# Patient Record
Sex: Female | Born: 1965
Health system: Southern US, Community
[De-identification: ages and names within clinical notes are randomized; demographics above are authoritative.]

## PROBLEM LIST (undated history)

## (undated) DIAGNOSIS — Z87442 Personal history of urinary calculi: Secondary | ICD-10-CM

## (undated) DIAGNOSIS — B009 Herpesviral infection, unspecified: Secondary | ICD-10-CM

## (undated) DIAGNOSIS — N200 Calculus of kidney: Secondary | ICD-10-CM

## (undated) DIAGNOSIS — E88819 Insulin resistance, unspecified: Secondary | ICD-10-CM

## (undated) DIAGNOSIS — N83202 Unspecified ovarian cyst, left side: Secondary | ICD-10-CM

## (undated) DIAGNOSIS — E8881 Metabolic syndrome: Secondary | ICD-10-CM

## (undated) HISTORY — DX: Herpesviral infection, unspecified: B00.9

## (undated) HISTORY — DX: Insulin resistance, unspecified: E88.819

## (undated) HISTORY — PX: NO PAST SURGERIES: SHX2092

## (undated) HISTORY — DX: Metabolic syndrome: E88.81

---
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
## 2002-04-03 DIAGNOSIS — B009 Herpesviral infection, unspecified: Secondary | ICD-10-CM

## 2002-04-03 HISTORY — DX: Herpesviral infection, unspecified: B00.9

## 2003-02-18 ENCOUNTER — Other Ambulatory Visit: Admission: RE | Admit: 2003-02-18 | Discharge: 2003-02-18 | Payer: Self-pay | Admitting: *Deleted

## 2003-02-19 ENCOUNTER — Encounter: Admission: RE | Admit: 2003-02-19 | Discharge: 2003-02-19 | Payer: Self-pay | Admitting: *Deleted

## 2004-04-25 ENCOUNTER — Other Ambulatory Visit: Admission: RE | Admit: 2004-04-25 | Discharge: 2004-04-25 | Payer: Self-pay | Admitting: *Deleted

## 2005-05-15 ENCOUNTER — Other Ambulatory Visit: Admission: RE | Admit: 2005-05-15 | Discharge: 2005-05-15 | Payer: Self-pay | Admitting: Obstetrics & Gynecology

## 2006-10-23 ENCOUNTER — Other Ambulatory Visit: Admission: RE | Admit: 2006-10-23 | Discharge: 2006-10-23 | Payer: Self-pay | Admitting: Obstetrics & Gynecology

## 2007-01-31 ENCOUNTER — Encounter: Admission: RE | Admit: 2007-01-31 | Discharge: 2007-01-31 | Payer: Self-pay | Admitting: Obstetrics & Gynecology

## 2008-01-27 ENCOUNTER — Other Ambulatory Visit: Admission: RE | Admit: 2008-01-27 | Discharge: 2008-01-27 | Payer: Self-pay | Admitting: Obstetrics & Gynecology

## 2008-02-03 ENCOUNTER — Encounter: Admission: RE | Admit: 2008-02-03 | Discharge: 2008-02-03 | Payer: Self-pay | Admitting: Obstetrics & Gynecology

## 2008-11-01 LAB — HM MAMMOGRAPHY: HM Mammogram: NORMAL

## 2010-03-03 LAB — HM PAP SMEAR: HM Pap smear: NORMAL

## 2010-10-25 ENCOUNTER — Telehealth: Payer: Self-pay | Admitting: *Deleted

## 2010-10-25 NOTE — Telephone Encounter (Signed)
Yes, work her in whenever she wants to be seen!

## 2010-10-25 NOTE — Telephone Encounter (Signed)
Patient requesting earlier new patient apt. She is scheduled for 8/22. She states she is in more pain than when she last saw Dr Yetta Barre. Ok for earlier new patient apt?

## 2010-10-26 NOTE — Telephone Encounter (Signed)
Rescheduled

## 2010-11-02 ENCOUNTER — Ambulatory Visit (INDEPENDENT_AMBULATORY_CARE_PROVIDER_SITE_OTHER)
Admission: RE | Admit: 2010-11-02 | Discharge: 2010-11-02 | Disposition: A | Discharge status: Home / Self Care | Payer: BC Managed Care – PPO | Source: Ambulatory Visit | Attending: Internal Medicine | Admitting: Internal Medicine

## 2010-11-02 ENCOUNTER — Encounter: Payer: Self-pay | Admitting: Internal Medicine

## 2010-11-02 ENCOUNTER — Ambulatory Visit (INDEPENDENT_AMBULATORY_CARE_PROVIDER_SITE_OTHER): Payer: BC Managed Care – PPO | Admitting: Internal Medicine

## 2010-11-02 VITALS — BP 102/64 | HR 58 | Temp 98.8°F | Resp 16 | Ht 61.0 in | Wt 135.0 lb

## 2010-11-02 DIAGNOSIS — M545 Low back pain, unspecified: Secondary | ICD-10-CM

## 2010-11-02 DIAGNOSIS — M79604 Pain in right leg: Secondary | ICD-10-CM

## 2010-11-02 MED ORDER — PREGABALIN 50 MG PO CAPS: 50.0000 mg | ORAL_CAPSULE | Freq: Every evening | ORAL | Status: DC | PRN

## 2010-11-02 NOTE — Patient Instructions (Signed)
Back Pain (Lumbosacral Strain) Back pain is one of the most common causes of pain. There are many causes of back pain. Most are not serious conditions.  CAUSES Your backbone (spinal column) is made up of 24 main vertebral bodies, the sacrum, and the coccyx. These are held together by muscles and tough, fibrous tissue (ligaments). Nerve roots pass through the openings between the vertebrae. A sudden move or injury to the back may cause injury to, or pressure on, these nerves. This may result in localized back pain or pain movement (radiation) into the buttocks, down the leg, and into the foot. Sharp, shooting pain from the buttock down the back of the leg (sciatica) is frequently associated with a ruptured (herniated) disc. Pain may be caused by muscle spasm alone. Your caregiver can often find the cause of your pain by the details of your symptoms and an exam. In some cases, you may need tests (such as X-rays). Your caregiver will work with you to decide if any tests are needed based on your specific exam. HOME CARE INSTRUCTIONS  Avoid an underactive lifestyle. Active exercise, as directed by your caregiver, is your greatest weapon against back pain.   Avoid hard physical activities (tennis, racquetball, water-skiing) if you are not in proper physical condition for it. This may aggravate and/or create problems.   If you have a back problem, avoid sports requiring sudden body movements. Swimming and walking are generally safer activities.   Maintain good posture.   Avoid becoming overweight (obese).   Use bed rest for only the most extreme, sudden (acute) episode. Your caregiver will help you determine how much bed rest is necessary.   For acute conditions, you may put ice on the injured area.   Put ice in a plastic bag.   Place a towel between your skin and the bag.   Leave the ice on for 20 minutes at a time, every 2 hours, or as needed.   After you are improved and more active, it may  help to apply heat for 30 minutes before activities.  See your caregiver if you are having pain that lasts longer than expected. Your caregiver can advise appropriate exercises and/or therapy if needed. With conditioning, most back problems can be avoided. SEEK IMMEDIATE MEDICAL CARE IF:  You have numbness, tingling, weakness, or problems with the use of your arms or legs.   You experience severe back pain not relieved with medicines.   There is a change in bowel or bladder control.   You have increasing pain in any area of the body, including your belly (abdomen).   You notice shortness of breath, dizziness, or feel faint.   You feel sick to your stomach (nauseous), are throwing up (vomiting), or become sweaty.   You notice discoloration of your toes or legs, or your feet get very cold.   Your back pain is getting worse.   You have an oral temperature above 100.5, not controlled by medicine.  MAKE SURE YOU:   Understand these instructions.   Will watch your condition.   Will get help right away if you are not doing well or get worse.  Document Released: 12/28/2004 Document Re-Released: 06/14/2009 Exit[employer] Patient Information 2011 [employer]. 

## 2010-11-02 NOTE — Progress Notes (Signed)
Subjective:    Patient ID: Jill Craig, female    DOB: [birth year], 45 y.o.   MRN: [id number]  Back Pain This is a new problem. The current episode started more than 1 month ago (6 months ago). The problem occurs intermittently. The problem has been gradually worsening since onset. The pain is present in the lumbar spine. The quality of the pain is described as aching, burning and shooting. The pain radiates to the left thigh and right thigh. The pain is at a severity of 5/10. The pain is moderate. The pain is worse during the day. The symptoms are aggravated by sitting and bending. Stiffness is present all day. Pertinent negatives include no abdominal pain, bladder incontinence, bowel incontinence, chest pain, dysuria, fever, headaches, leg pain, numbness, paresis, paresthesias, pelvic pain, perianal numbness, tingling, weakness or weight loss. She has tried NSAIDs for the symptoms. The treatment provided mild relief.      Review of Systems  Constitutional: Negative for fever, chills, weight loss, diaphoresis, activity change, appetite change, fatigue and unexpected weight change.  HENT: Negative.   Eyes: Negative.   Respiratory: Negative.   Cardiovascular: Negative.  Negative for chest pain.  Gastrointestinal: Negative.  Negative for abdominal pain and bowel incontinence.  Genitourinary: Negative for bladder incontinence, dysuria, urgency, frequency, hematuria, flank pain, decreased urine volume, enuresis, difficulty urinating, pelvic pain and dyspareunia.  Musculoskeletal: Positive for back pain. Negative for myalgias, joint swelling, arthralgias and gait problem.  Skin: Negative.   Neurological: Negative for dizziness, tingling, tremors, seizures, syncope, facial asymmetry, speech difficulty, weakness, light-headedness, numbness, headaches and paresthesias.  Hematological: Negative for adenopathy. Does not bruise/bleed easily.  Psychiatric/Behavioral: Negative.        Objective:   Physical Exam  Vitals reviewed. Constitutional: She is oriented to person, place, and time. She appears well-developed and well-nourished. No distress.  HENT:  Head: Normocephalic and atraumatic.  Right Ear: External ear normal.  Left Ear: External ear normal.  Nose: Nose normal.  Mouth/Throat: Oropharynx is clear and moist. No oropharyngeal exudate.  Eyes: Conjunctivae and EOM are normal. Pupils are equal, round, and reactive to light. Right eye exhibits no discharge. Left eye exhibits no discharge. No scleral icterus.  Neck: Normal range of motion. Neck supple. No JVD present. No tracheal deviation present. No thyromegaly present.  Cardiovascular: Normal rate, regular rhythm, normal heart sounds and intact distal pulses.  Exam reveals no gallop and no friction rub.   No murmur heard. Pulmonary/Chest: Effort normal and breath sounds normal. No stridor. No respiratory distress. She has no wheezes. She has no rales. She exhibits no tenderness.  Abdominal: Soft. Bowel sounds are normal. She exhibits no distension and no mass. There is no tenderness. There is no rebound and no guarding.  Musculoskeletal: Normal range of motion. She exhibits no edema and no tenderness.       Lumbar back: Normal. She exhibits normal range of motion, no tenderness, no bony tenderness, no swelling, no edema, no deformity, no laceration, no pain, no spasm and normal pulse.  Lymphadenopathy:    She has no cervical adenopathy.  Neurological: She is alert and oriented to person, place, and time. She has normal strength. She is not disoriented. She displays no atrophy, no tremor and normal reflexes. No cranial nerve deficit or sensory deficit. She exhibits normal muscle tone. She displays a negative Romberg sign. She displays no seizure activity. Coordination and gait normal.  Reflex Scores:      Tricep reflexes are 1+ on the right side  and 1+ on the left side.      Bicep reflexes are 1+ on the right side and 1+ on the  left side.      Brachioradialis reflexes are 1+ on the right side and 1+ on the left side.      Patellar reflexes are 0 on the right side and 0 on the left side.      Achilles reflexes are 0 on the right side and 0 on the left side. Skin: Skin is warm and dry. No rash noted. She is not diaphoretic. No erythema. No pallor.  Psychiatric: She has a normal mood and affect. Her behavior is normal. Judgment and thought content normal.          Assessment & Plan:

## 2010-11-03 NOTE — Assessment & Plan Note (Signed)
Plain films show minimal facet arthropathy, I think she has HNP so I will start her on Lyrica for pain and get an MRI done of her lower back to obtain more info

## 2010-11-09 ENCOUNTER — Other Ambulatory Visit: Payer: Self-pay | Admitting: Internal Medicine

## 2010-11-09 DIAGNOSIS — M545 Low back pain, unspecified: Secondary | ICD-10-CM

## 2010-11-09 DIAGNOSIS — M79604 Pain in right leg: Secondary | ICD-10-CM

## 2010-11-23 ENCOUNTER — Ambulatory Visit: Payer: Self-pay | Admitting: Internal Medicine

## 2010-11-24 ENCOUNTER — Encounter: Payer: BC Managed Care – PPO | Admitting: Neurosurgery

## 2010-11-30 ENCOUNTER — Encounter: Payer: BC Managed Care – PPO | Attending: Neurosurgery | Admitting: Neurosurgery

## 2010-11-30 DIAGNOSIS — M129 Arthropathy, unspecified: Secondary | ICD-10-CM | POA: Insufficient documentation

## 2010-11-30 DIAGNOSIS — M545 Low back pain, unspecified: Secondary | ICD-10-CM

## 2010-11-30 DIAGNOSIS — G8929 Other chronic pain: Secondary | ICD-10-CM | POA: Insufficient documentation

## 2010-11-30 NOTE — Progress Notes (Signed)
HISTORY OF PRESENT ILLNESS:  Ms. Jill Craig is a new patient referred to clinic through her primary care doctor, Dr. Sanda Linger.  The patient is seen for low back pain.  She states she has had no radiculopathy, but increasing central low back pain and over the last 6 months she noticed she is losing a lot of flexibility.  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  Her husband is also an Naval architect and does the same type of work.  She states she only takes 6-8 ibuprofen a day without any prescription medicines.  She has had no other treatment.  The patient states that she cannot exactly pinpoint what starts or triggers her pain and it does fluctuate from time to time but it is becoming more annoying for her.  She states she used to be active with tae kwon do when she was in her 61s and up until 6 months ago, could still do many of the movements she did when she practiced as well as she could bend from the waist and put her hands flat on the floor but she cannot even attempt that now.  She rates her average pain at 3-7, that is an aching type pain.  General activity level is 2-9. Pain is worse during the day and evening.  Sleep patterns are fair. Pain is worse with bending and sitting.  Medication tends to help.  She does climb steps and drive.  She walks unassisted.  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  REVIEW OF SYSTEMS:  Notable for those difficulties as well as some mild weight gain otherwise within normal limits.  Her Oswestry score is 42. The only physician involved in her care is her primary care doctor, Dr. Yetta Barre.  X-rays that he ordered were reviewed today with the patient and then only shows normal vertebral body height.  No pars defects were even identified.  No fractures.  Mild facet arthropathy at L4-L5.  PAST  MEDICAL HISTORY:  Benign.  She has had no surgeries.  No difficulties.  MEDICATIONS:  She takes no medicine.  SOCIAL HISTORY:  She is married, lives with her husband.  FAMILY HISTORY:  Not obtained.  PHYSICAL EXAMINATION:  VITAL SIGNS:  Her blood pressure is 96/56, pulse 55, respirations 18, and O2 sats 99 on room air. CONSTITUTIONAL:  She is within normal limits. NEUROLOGIC:  She is alert and oriented x3.  She is very pleasant.  She has a normal gait.  Motor strength is 5/5 in the upper and lower extremities including the deltoid, biceps, triceps, intrinsics, grip, iliopsoas, quadriceps, dorsiflexors, EHL, and plantar flexors.  Her toes are downgoing.  Her reflexes are absent in the upper and lower extremities bilaterally.  Her sensation is intact to pinprick throughout the upper and lower extremities.  She can flex about 90 degrees, extend about 10 degrees with mild pain with flexion.  She is not point tender in her lumbar spine or any fibromyalgia points.  ASSESSMENT:  Chronic low back pain, worsening over the past 6 months, unknown etiology.  PLAN: 1. We will start her on Sterapred 6-day Dosepak 10 mg.  She will take     that and see if she gets any relief that may be diagnostic for     inflammation or an inflammatory process. 2. She will complete her UDS and review that  at her next appointment.     We are going to obtain an MRI of the lumbar spine without     gadolinium and bring her back to review that with Dr. Wynn Banker,     possible injection     review dependent on what the MRI reveals.  We will see her back in     the clinic in 4-6 weeks.  Her questions were otherwise encouraged     and answered.     Costantino Kohlbeck L. Blima Dessert Electronically Signed    RLW/MedQ D:  11/30/2010 11:23:58  T:  11/30/2010 11:49:55  Job #:  [id number]  cc:   Tia Alert, MD Fax: [phone]

## 2010-12-15 ENCOUNTER — Other Ambulatory Visit: Payer: Self-pay | Admitting: Physical Medicine & Rehabilitation

## 2010-12-15 ENCOUNTER — Ambulatory Visit (HOSPITAL_COMMUNITY)
Admission: RE | Admit: 2010-12-15 | Discharge: 2010-12-15 | Disposition: A | Discharge status: Home / Self Care | Payer: BC Managed Care – PPO | Source: Ambulatory Visit | Attending: Physical Medicine & Rehabilitation | Admitting: Physical Medicine & Rehabilitation

## 2010-12-15 ENCOUNTER — Other Ambulatory Visit (HOSPITAL_COMMUNITY): Payer: BC Managed Care – PPO

## 2010-12-15 DIAGNOSIS — M51379 Other intervertebral disc degeneration, lumbosacral region without mention of lumbar back pain or lower extremity pain: Secondary | ICD-10-CM | POA: Insufficient documentation

## 2010-12-15 DIAGNOSIS — M5137 Other intervertebral disc degeneration, lumbosacral region: Secondary | ICD-10-CM | POA: Insufficient documentation

## 2010-12-15 DIAGNOSIS — R52 Pain, unspecified: Secondary | ICD-10-CM

## 2010-12-15 DIAGNOSIS — M545 Low back pain, unspecified: Secondary | ICD-10-CM | POA: Insufficient documentation

## 2010-12-20 ENCOUNTER — Encounter: Payer: BC Managed Care – PPO | Attending: Neurosurgery

## 2010-12-20 ENCOUNTER — Ambulatory Visit (HOSPITAL_BASED_OUTPATIENT_CLINIC_OR_DEPARTMENT_OTHER): Payer: BC Managed Care – PPO | Admitting: Physical Medicine & Rehabilitation

## 2010-12-20 DIAGNOSIS — G8929 Other chronic pain: Secondary | ICD-10-CM | POA: Insufficient documentation

## 2010-12-20 DIAGNOSIS — M545 Low back pain, unspecified: Secondary | ICD-10-CM | POA: Insufficient documentation

## 2010-12-20 DIAGNOSIS — M129 Arthropathy, unspecified: Secondary | ICD-10-CM | POA: Insufficient documentation

## 2010-12-20 DIAGNOSIS — M533 Sacrococcygeal disorders, not elsewhere classified: Secondary | ICD-10-CM

## 2010-12-21 NOTE — Assessment & Plan Note (Signed)
Ms. Lacasse returns today.  She saw my mid-level last month.  She has a history of lumbar pain increasing over the last 6 months.  She had original low back injury when she was about 45 years old lifting something at work.  She went through some chiropractic treatments and got all the way better.  She complains of decreased flexibility.  She also complains of decreased sitting tolerance.  She also has difficulty sleeping in a certain position.  She has a 6-7/10 pain.  She is reluctant to take too many medications.  She does get some relief with the ibuprofen.  She had a few days of complete relief after the Medrol Dosepak, but pain has returned.  She walks without assistance.  She can walk 2 hours at a time.  She climbs steps.  She drives.  She works 40 hours a week plus.  REVIEW OF SYSTEMS:  Positive for weight gain, otherwise negative.  X-rays reviewed revealing some anterolisthesis which is minimal L4 and 5 due to facet degeneration.  Of note is that the patient states that when she was extending for the x-rays, this did cause her back pain to flare up.  SOCIAL HISTORY:  Married, lives with her husband.  Denies any alcohol or illegal drug use.  PHYSICAL EXAMINATION:  VITAL SIGNS:  Blood pressure 97/58, pulse 60, respirations 14, O2 sat 100% on room air. GENERAL:  A well-developed, well-nourished female in no acute distress. Mood and affect are appropriate. MUSCULOSKELETAL:  Motor strength 5/5 bilateral upper and lower extremities including deltoid biceps, triceps, hand intrinsic grip, iliopsoas, quadriceps, dorsiflexors, EHL, plantar flexors.  Toes are downgoing.  Reflexes are difficult to elicit in all four extremities. Sensation intact in lower extremities to pinprick.  She has normal range of motion in the lumbar spine.  She has more discomfort with extension and with flexion, although it is not clear cut and not severe.  Pearlean Brownie test is negative.  Stress in the SI is  positive on the right side, but not on the left side.  IMPRESSION:  Low back pain.  This is lower lumbar, this is around or below L5 nothing above L4 certainly.  There is no radicular component. I went through differential with her using spine model.  I think most likely it is sacroiliac disorder followed by facet disorder given some of her radiologic findings.  We will do diagnosis/therapeutic SI injection, next visit.  If she does not respond well to this, we would do facet injections.  Positive response will be followed up with physical therapy.  Negative response will be followed up with MRI.  Discussed with patient, agrees with plan. She will continue on ibuprofen which she will take with food now up to 8 tablets per day.     Erick Colace, M.D. Electronically Signed    AEK/MedQ D:  12/20/2010 13:11:12  T:  12/20/2010 20:46:29  Job #:  [id number]  cc:   Sanda Linger, MD [address] [place] [address] [place] [address]

## 2011-01-05 ENCOUNTER — Ambulatory Visit: Payer: BC Managed Care – PPO | Admitting: Physical Medicine & Rehabilitation

## 2012-08-14 ENCOUNTER — Telehealth: Payer: Self-pay | Admitting: *Deleted

## 2012-08-14 MED ORDER — VALACYCLOVIR HCL 500 MG PO TABS: 500.0000 mg | ORAL_TABLET | ORAL | Status: DC | PRN

## 2012-08-14 NOTE — Telephone Encounter (Signed)
Pt requested refills on her Valtrex Rx (via fax). pts next aex is 03/14/2013. Valtrex 500mg  po qd, prn #30/6 refills sent to pts pharmacy (in [place]).

## 2013-01-29 ENCOUNTER — Ambulatory Visit (INDEPENDENT_AMBULATORY_CARE_PROVIDER_SITE_OTHER): Payer: BC Managed Care – PPO | Admitting: Gynecology

## 2013-01-29 ENCOUNTER — Encounter: Payer: Self-pay | Admitting: Gynecology

## 2013-01-29 ENCOUNTER — Telehealth: Payer: Self-pay | Admitting: Obstetrics & Gynecology

## 2013-01-29 VITALS — BP 90/62 | HR 52 | Ht 61.0 in | Wt 125.0 lb

## 2013-01-29 DIAGNOSIS — N63 Unspecified lump in unspecified breast: Secondary | ICD-10-CM

## 2013-01-29 DIAGNOSIS — N6320 Unspecified lump in the left breast, unspecified quadrant: Secondary | ICD-10-CM

## 2013-01-29 DIAGNOSIS — N644 Mastodynia: Secondary | ICD-10-CM

## 2013-01-29 NOTE — Telephone Encounter (Signed)
Patient calling re: painful left breast and also feels a bump on it. Patient wants to be seen today.

## 2013-01-29 NOTE — Telephone Encounter (Signed)
Spoke with patient. She is very anxious about a lump that she felt in her breast last night, states that it is painful to palpate and it is visible. Denies redness or warmth of skin.  She requests appointment today. Has seen Dr. Hyacinth Meeker and Dr. Farrel Gobble previously. Patient would like to be seen today. Appointment scheduled for today with Dr. Farrel Gobble. Patient agreeable.   Routing to provider for final review. Patient agreeable to disposition. Will close encounter

## 2013-01-29 NOTE — Progress Notes (Signed)
Subjective:     Patient ID: Jill Craig, female   DOB: 11-Jan-1966, 47 y.o.   MRN: [id number]  HPI Comments: Pt here reporting a lump that she noticed in left breast yesterday, reports that area was tender that prompted inspection.  Pt denies any skin changes.  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  No change in activities, no upper extremity exercises.  Pt reports doing regular breast exam-once a month but hasn't had a mammogram in years.  Pt states she is sure that this lesion was not present 1w ago.  She continues to have regular cycles.  Pt's sister has breast cancer    Review of Systems  All other systems reviewed and are negative.       Objective:   Physical Exam  Nursing note and vitals reviewed. Constitutional: She appears well-developed and well-nourished.  Pulmonary/Chest: Right breast exhibits no inverted nipple, no mass, no nipple discharge, no skin change and no tenderness. Left breast exhibits mass and tenderness (generalized, morre so at sternum). Left breast exhibits no inverted nipple, no nipple discharge and no skin change. Breasts are symmetrical.    Lymphadenopathy:    She has no axillary adenopathy.       Right axillary: No pectoral and no lateral adenopathy present.       Left axillary: No pectoral and no lateral adenopathy present.      Right: No supraclavicular adenopathy present.       Left: No supraclavicular adenopathy present.       Assessment:     Mastalgia Breast mass on left Family history breast cancer      Plan:     Bilateral mammogram ordered Questions addressed

## 2013-01-31 ENCOUNTER — Telehealth: Payer: Self-pay | Admitting: Obstetrics & Gynecology

## 2013-01-31 NOTE — Telephone Encounter (Signed)
Patient was here on Wednesday and was told to call today if she hadn't heard from the breast center about a diagnostic mammogram. She still hasnt heard anything about it.

## 2013-01-31 NOTE — Telephone Encounter (Signed)
Called [facility] imaging, earliest available 11/18. Patient does not want to wait that long. [place], they can see her on 11/3, but will need records. Spoke with patient and she will call [facility] imaging to discuss records transfer and then call [facility] to schedule.  Written order for diagnostic MMG signed by Dr. Farrel Gobble and faxed with fax confirmation received from [facility].

## 2013-02-05 ENCOUNTER — Telehealth: Payer: Self-pay | Admitting: Gynecology

## 2013-02-05 NOTE — Telephone Encounter (Signed)
Dr. Farrel Gobble have you seen these results from [facility] yet?

## 2013-02-05 NOTE — Telephone Encounter (Signed)
Pt had a mammogram done and wants to know her results

## 2013-02-05 NOTE — Telephone Encounter (Signed)
Dr. Farrel Gobble, I called [facility] and asked them to fax results. They stated that the Doctor spoke with her at her appointment. I called and spoke with patient and she states that she was told that she has two cysts, one simple and one complex. Patient states that she would like to have both cysts removed and requests referral to General Surgery to do so.  I advised that I would send request to Dr. Farrel Gobble.

## 2013-02-06 NOTE — Telephone Encounter (Signed)
When it comes in, i will look at the report and then can make referral if indicated

## 2013-02-07 NOTE — Telephone Encounter (Signed)
Dr. Hyacinth Meeker has reviewed results. Suggest Calling [facility] to schedule U/S guided Cyst drainage. Message left to return call to Pike at [phone].

## 2013-02-07 NOTE — Telephone Encounter (Signed)
Patient returning Tracy's call. °

## 2013-02-07 NOTE — Telephone Encounter (Signed)
Order signed and faxed to [facility]. Patient will call for appointment.

## 2013-03-14 ENCOUNTER — Other Ambulatory Visit: Payer: Self-pay | Admitting: Gynecology

## 2013-03-14 ENCOUNTER — Encounter: Payer: Self-pay | Admitting: Gynecology

## 2013-03-14 ENCOUNTER — Ambulatory Visit (INDEPENDENT_AMBULATORY_CARE_PROVIDER_SITE_OTHER): Payer: BC Managed Care – PPO | Admitting: Gynecology

## 2013-03-14 VITALS — BP 112/80 | HR 60 | Resp 12 | Ht 63.5 in | Wt 131.0 lb

## 2013-03-14 DIAGNOSIS — Z01419 Encounter for gynecological examination (general) (routine) without abnormal findings: Secondary | ICD-10-CM

## 2013-03-14 DIAGNOSIS — N6009 Solitary cyst of unspecified breast: Secondary | ICD-10-CM

## 2013-03-14 DIAGNOSIS — B009 Herpesviral infection, unspecified: Secondary | ICD-10-CM

## 2013-03-14 DIAGNOSIS — N6002 Solitary cyst of left breast: Secondary | ICD-10-CM

## 2013-03-14 DIAGNOSIS — Z0000 Encounter for general adult medical examination without abnormal findings: Secondary | ICD-10-CM

## 2013-03-14 LAB — CHOLESTEROL, TOTAL: Cholesterol: 188 mg/dL (ref 0–200)

## 2013-03-14 LAB — HEMOGLOBIN, FINGERSTICK: Hemoglobin, fingerstick: 11.3 g/dL — ABNORMAL LOW (ref 12.0–16.0)

## 2013-03-14 NOTE — Patient Instructions (Signed)

## 2013-03-14 NOTE — Progress Notes (Addendum)
47 y.o. Married Hispanic female   [id number] here for annual exam. Pt is currently sexually active.  Pt is scheduled to have breast biopsy next week, 2 to be removed on left.  Pt had enough valtrex, no contraception.  Has enough valtrex for now, using rarely.  [name] last menstrual period was 03/09/2013.          Sexually active: yes  The current method of family planning is none.    Exercising: yes  walk 2-3x/wk 5 miles Last pap: 02/28/11  Alcohol: 3-5 drinks/wk Tobacco: no BSE: yes MMG: 02/03/13 Incomplete, 02/03/13 Breast ultrasound f/u in 6 months has cyst aspiration scheduled.  Hgb: 11.3 ; Urine: Unable to void    Health Maintenance  Topic Date Due  . Influenza Vaccine  11/01/2012  . Pap Smear  02/27/2014  . Tetanus/tdap  04/03/2016    Family History  Problem Relation Age of Onset  . Prostate cancer Father   . Breast cancer Sister 88    Genetics negative    Patient Active Problem List   Diagnosis Date Noted  . Low back pain radiating to both legs 11/02/2010    Past Medical History  Diagnosis Date  . HSV infection 2004    R Buttock    No past surgical history on file.  Allergies: Review of patient's allergies indicates no known allergies.  Current Outpatient Prescriptions  Medication Sig Dispense Refill  . valACYclovir (VALTREX) 500 MG tablet Take 1 tablet (500 mg total) by mouth as needed.  30 tablet  6   No current facility-administered medications for this visit.    ROS: Pertinent items are noted in HPI.  Exam:    BP 112/80  Pulse 60  Resp 12  Ht 5' 3.5" (1.613 m)  Wt 131 lb (59.421 kg)  BMI 22.84 kg/m2  LMP 03/09/2013 Weight change: @WEIGHTCHANGE @ Last 3 height recordings:  Ht Readings from Last 3 Encounters:  03/14/13 5' 3.5" (1.613 m)  01/29/13 5\' 1"  (1.549 m)  11/02/10 5\' 1"  (1.549 m)   General appearance: alert, cooperative and appears stated age Head: Normocephalic, without obvious abnormality, atraumatic Neck: no adenopathy, no  carotid bruit, no JVD, supple, symmetrical, trachea midline and thyroid not enlarged, symmetric, no tenderness/mass/nodules Lungs: clear to auscultation bilaterally Breasts: normal appearance, no masses or tenderness Heart: regular rate and rhythm, S1, S2 normal, no murmur, click, rub or gallop Abdomen: soft, non-tender; bowel sounds normal; no masses,  no organomegaly Extremities: extremities normal, atraumatic, no cyanosis or edema Skin: Skin color, texture, turgor normal. No rashes or lesions Lymph nodes: Cervical, supraclavicular, and axillary nodes normal. no inguinal nodes palpated Neurologic: Grossly normal   Pelvic: External genitalia:  no lesions              Urethra: normal appearing urethra with no masses, tenderness or lesions              Bartholins and Skenes: normal                 Vagina: normal appearing vagina with normal color and discharge, no lesions              Cervix: normal appearance              Pap taken: no        Bimanual Exam:  Uterus:  uterus is normal size, shape, consistency and nontender  Adnexa:    normal adnexa in size, nontender and no masses                                      Rectovaginal: Confirms                                      Anus:  normal sphincter tone, no lesions  A: well woman Ok with conception Breast cyst for removal HSV infection    P: mammogram as scheduled pap smear not done counseled on breast self exam, mammography screening, adequate intake of calcium and vitamin D, diet and exercise return annually or prn   An After Visit Summary was printed and given to the patient.

## 2013-03-14 NOTE — Addendum Note (Signed)
Addended by: Lorraine Lax on: 03/14/2013 04:00 PM   Modules accepted: Orders

## 2013-03-15 LAB — TSH: TSH: 1.343 u[IU]/mL (ref 0.350–4.500)

## 2013-03-17 LAB — HDL CHOLESTEROL: HDL: 70 mg/dL (ref >39)

## 2013-03-17 LAB — TRIGLYCERIDES: Triglycerides: 79 mg/dL (ref <150)

## 2013-03-18 ENCOUNTER — Telehealth: Payer: Self-pay | Admitting: *Deleted

## 2013-03-18 NOTE — Telephone Encounter (Signed)
Patient notified see labs 

## 2013-03-18 NOTE — Telephone Encounter (Signed)
Left Message To Call Back  

## 2013-03-18 NOTE — Telephone Encounter (Signed)
Message copied by Lorraine Lax on Tue Mar 18, 2013  8:45 AM ------      Message from: Douglass Rivers      Created: Mon Mar 17, 2013  8:07 PM       Inform cholesterol excellent ------

## 2013-03-24 ENCOUNTER — Telehealth: Payer: Self-pay | Admitting: Obstetrics & Gynecology

## 2013-03-24 NOTE — Telephone Encounter (Signed)
Patient is asking to talk with Dr.Miller's nurse. (no details given)

## 2013-03-24 NOTE — Telephone Encounter (Signed)
Spoke with patient. She states she has been having uti symptoms since Thursday, having dysuria and urinary frequency. No fevers or flank pain. Using otc AZO with relief, but then symptoms immediately return.  She is currently out of town in [place]. Advised patient that she will need to be evaluated by provider while out of town, suggested urgent care and seen for UA and urine culture to r/o any other infection. Patient states that is very inconvenient and I apologized for the inconvenience but it is our policy that patient be seen for evaluation.   Routing to provider for final review. Patient agreeable to disposition. Will close encounter

## 2013-03-25 ENCOUNTER — Telehealth: Payer: Self-pay | Admitting: Obstetrics & Gynecology

## 2013-03-25 MED ORDER — CIPROFLOXACIN HCL 500 MG PO TABS: 500.0000 mg | ORAL_TABLET | Freq: Two times a day (BID) | ORAL | Status: DC

## 2013-03-25 NOTE — Telephone Encounter (Signed)
Pt says she is returning a call to [place] with information regarding pharmacy for Dr Hyacinth Meeker. [facility] in [place], [place] [address] to call in medication.

## 2013-03-25 NOTE — Telephone Encounter (Signed)
Please let her know RX for cipro 500mg  bid x 5 days sent to pharmacy.  If symptoms worsen, go be seen locally.  Thanks.

## 2013-03-25 NOTE — Telephone Encounter (Signed)
Routing to Dr. Hyacinth Meeker for medication order. Pharmacy placed in order entry area.

## 2013-03-25 NOTE — Telephone Encounter (Signed)
Message left to return call to Jill Craig at [phone].    

## 2013-05-02 ENCOUNTER — Encounter: Payer: Self-pay | Admitting: Obstetrics & Gynecology

## 2013-06-05 ENCOUNTER — Telehealth: Payer: Self-pay | Admitting: Obstetrics & Gynecology

## 2013-06-05 NOTE — Telephone Encounter (Signed)
Spoke with patient. She has noticed again, mass in L Breast.   Dr. Sabra Heck not in office. Patient has seen Dr. Charlies Constable prior. Agreeable to appointment with Dr. Charlies Constable.  Scheduled for 3/6 at 1200.   Routing to provider for final review. Patient agreeable to disposition. Will close encounter

## 2013-06-05 NOTE — Telephone Encounter (Signed)
Patient is calling patient said she has some lumps on her left breast. Said she had some previously and they were taken out 5 or 6 months ago. Said they are bigger than those were.

## 2013-06-06 ENCOUNTER — Ambulatory Visit (INDEPENDENT_AMBULATORY_CARE_PROVIDER_SITE_OTHER): Payer: BC Managed Care – PPO | Admitting: Gynecology

## 2013-06-06 ENCOUNTER — Encounter: Payer: Self-pay | Admitting: Gynecology

## 2013-06-06 VITALS — BP 92/60 | HR 68 | Resp 14 | Ht 63.5 in | Wt 128.0 lb

## 2013-06-06 DIAGNOSIS — N644 Mastodynia: Secondary | ICD-10-CM

## 2013-06-06 DIAGNOSIS — N6019 Diffuse cystic mastopathy of unspecified breast: Secondary | ICD-10-CM | POA: Insufficient documentation

## 2013-06-06 DIAGNOSIS — N63 Unspecified lump in unspecified breast: Secondary | ICD-10-CM

## 2013-06-06 DIAGNOSIS — N6320 Unspecified lump in the left breast, unspecified quadrant: Secondary | ICD-10-CM

## 2013-06-06 NOTE — Progress Notes (Signed)
Subjective:     Patient ID: Jill Craig, female   DOB: 01-13-1966, 48 y.o.   MRN: [id number]  HPI Comments: Pt here reporting a mass in her left breast that she noticed a few days ago, found on routine breast exam.  Pt recently had 2 cysts drained in 12/14 and post procedure showed clearance. Pt had breast cancer at 48yo, genetics negative.   Pt reports that her cousin was diagnosed with brain mass, primary vs metastatic?  Pt is overly concerned about her own health.      Review of Systems Per HPI    Objective:   Physical Exam  Nursing note and vitals reviewed. Constitutional: She is oriented to person, place, and time. She appears well-developed and well-nourished.  Pulmonary/Chest: Right breast exhibits no inverted nipple, no nipple discharge, no skin change and no tenderness. Left breast exhibits mass and tenderness. Left breast exhibits no inverted nipple, no nipple discharge and no skin change.    Neurological: She is alert and oriented to person, place, and time.       Assessment:     Recent breast cyst aspiration with recurrence of tenderness and mass     Plan:     Pt was due for repeat imaging in 5/15, will get now and triage accordingly Sympathy re cousin given

## 2014-05-13 ENCOUNTER — Telehealth: Payer: Self-pay | Admitting: Obstetrics & Gynecology

## 2014-05-13 NOTE — Telephone Encounter (Signed)
LMTCB about cx appointment with Dr. Sabra Heck

## 2014-05-14 ENCOUNTER — Ambulatory Visit: Payer: BC Managed Care – PPO | Admitting: Obstetrics & Gynecology

## 2014-05-18 ENCOUNTER — Ambulatory Visit: Payer: Self-pay | Admitting: Nurse Practitioner

## 2015-06-12 ENCOUNTER — Encounter (HOSPITAL_COMMUNITY): Payer: Self-pay | Admitting: *Deleted

## 2015-06-12 ENCOUNTER — Emergency Department (HOSPITAL_COMMUNITY): Payer: BLUE CROSS/BLUE SHIELD

## 2015-06-12 ENCOUNTER — Emergency Department (HOSPITAL_COMMUNITY)
Admission: EM | Admit: 2015-06-12 | Discharge: 2015-06-12 | Disposition: A | Discharge status: Home / Self Care | Payer: BLUE CROSS/BLUE SHIELD | Attending: Emergency Medicine | Admitting: Emergency Medicine

## 2015-06-12 DIAGNOSIS — Z87891 Personal history of nicotine dependence: Secondary | ICD-10-CM | POA: Diagnosis not present

## 2015-06-12 DIAGNOSIS — H9209 Otalgia, unspecified ear: Secondary | ICD-10-CM | POA: Insufficient documentation

## 2015-06-12 DIAGNOSIS — Z8619 Personal history of other infectious and parasitic diseases: Secondary | ICD-10-CM | POA: Diagnosis not present

## 2015-06-12 DIAGNOSIS — J189 Pneumonia, unspecified organism: Secondary | ICD-10-CM

## 2015-06-12 DIAGNOSIS — R05 Cough: Secondary | ICD-10-CM | POA: Diagnosis present

## 2015-06-12 DIAGNOSIS — J159 Unspecified bacterial pneumonia: Secondary | ICD-10-CM | POA: Diagnosis not present

## 2015-06-12 LAB — RAPID STREP SCREEN (MED CTR MEBANE ONLY): Streptococcus, Group A Screen (Direct): NEGATIVE

## 2015-06-12 MED ORDER — BENZONATATE 100 MG PO CAPS
100.0000 mg | ORAL_CAPSULE | Freq: Once | ORAL | Status: AC
  Administered 2015-06-12: 100 mg via ORAL
  Filled 2015-06-12: qty 1

## 2015-06-12 MED ORDER — AZITHROMYCIN 250 MG PO TABS: 250.0000 mg | ORAL_TABLET | Freq: Every day | ORAL | Status: DC

## 2015-06-12 MED ORDER — IBUPROFEN 800 MG PO TABS
800.0000 mg | ORAL_TABLET | Freq: Once | ORAL | Status: AC
  Administered 2015-06-12: 800 mg via ORAL
  Filled 2015-06-12: qty 1

## 2015-06-12 NOTE — ED Provider Notes (Signed)
CSN: [id number]     Arrival date & time 06/12/15  1239 History  By signing my name below, I, Jill Craig, attest that this documentation has been prepared under the direction and in the presence of [name], [place]. Electronically Signed: Jolayne Craig, Scribe. 06/12/2015. 1:49 PM.   Chief Complaint  Patient presents with  . Sore Throat  . Cough    The history is provided by the patient. No language interpreter was used.    HPI Comments: Jill Craig is a 50 y.o. female who presents to the Emergency Department complaining of sudden onset, constant, mild sore throat and dry cough which began five days ago. She also notes an associated, subjective low grade fever three days ago as well as ear pain and nasal congestion. She reports headache and abdominal pain while coughing. She was seen at [facility] three days ago, where she received a flu test and a strep test, both of which were negative, and was prescribed tessalon and naproxen, which have provided her with no relief.   Past Medical History  Diagnosis Date  . HSV infection 2004    R Buttock   History reviewed. No pertinent past surgical history. Family History  Problem Relation Age of Onset  . Prostate cancer Father   . Breast cancer Sister 54    Genetics negative   Social History  Substance Use Topics  . Smoking status: Former Smoker    Quit date: 11/02/1990  . Smokeless tobacco: None  . Alcohol Use: 8.4 oz/week    14 Glasses of wine per week   OB History    Gravida Para Term Preterm AB TAB SAB Ectopic Multiple Living   0 0 0 0 0 0 0 0 0 0       Review of Systems  Constitutional: Positive for fever and chills.  HENT: Positive for congestion, ear pain and sore throat.   Respiratory: Positive for cough.   Gastrointestinal: Positive for abdominal pain.  Neurological: Positive for headaches.    Allergies  Review of patient's allergies indicates no known allergies.  Home Medications   Prior to  Admission medications   Medication Sig Start Date End Date Taking? Authorizing Provider  azithromycin (ZITHROMAX) 250 MG tablet Take 1 tablet (250 mg total) by mouth daily. Take first 2 tablets together, then 1 every day until finished. 06/12/15   [name], PA-C  valACYclovir (VALTREX) 500 MG tablet Take 1 tablet (500 mg total) by mouth as needed. 08/14/12   [name], MD    BP 94/61 mmHg  Pulse 68  Temp(Src) 98.7 F (37.1 C) (Oral)  Resp 18  SpO2 96%  LMP 05/18/2015 (Exact Date) Physical Exam  Constitutional: She is oriented to person, place, and time. She appears well-developed and well-nourished. No distress.  HENT:  Head: Normocephalic and atraumatic.  Right Ear: Hearing, tympanic membrane, external ear and ear canal normal.  Left Ear: Hearing, tympanic membrane, external ear and ear canal normal.  Nose: Nose normal.  Mouth/Throat: Uvula is midline and mucous membranes are normal. Posterior oropharyngeal erythema present. No oropharyngeal exudate, posterior oropharyngeal edema or tonsillar abscesses.  Mild erythema to posterior oropharynx. Patient handling secretions without difficulty.  Eyes: Conjunctivae, EOM and lids are normal. Pupils are equal, round, and reactive to light. Right eye exhibits no discharge. Left eye exhibits no discharge. No scleral icterus.  Neck: Normal range of motion. Neck supple.  Cardiovascular: Normal rate, regular rhythm, normal heart sounds, intact distal pulses and normal pulses.  Pulmonary/Chest: Effort normal and breath sounds normal. No respiratory distress. She has no wheezes. She has no rales.  Abdominal: Soft. Normal appearance and bowel sounds are normal. She exhibits no distension and no mass. There is no tenderness. There is no rigidity, no rebound and no guarding.  Musculoskeletal: Normal range of motion. She exhibits no edema or tenderness.  Neurological: She is alert and oriented to person, place, and time.  Skin: Skin is  warm, dry and intact. No rash noted. She is not diaphoretic. No erythema. No pallor.  Psychiatric: She has a normal mood and affect. Her speech is normal and behavior is normal.  Nursing note and vitals reviewed.   ED Course  Procedures   DIAGNOSTIC STUDIES: Oxygen Saturation is 96% on RA, adequate by my interpretation.   COORDINATION OF CARE: 1:49 PM Will order chest x-ray and strep test. Will administer pt cough medication in the ED. Discussed treatment plan with pt at bedside and pt agreed to plan.   Labs Review Labs Reviewed  RAPID STREP SCREEN (NOT AT [facility])  CULTURE, GROUP A STREP [facility])    Imaging Review Dg Chest 2 View  06/12/2015  CLINICAL DATA:  Cough and fever EXAM: CHEST  2 VIEW COMPARISON:  None. FINDINGS: Question patchy right parahilar airspace disease. No edema or pleural effusion. The cardiopericardial silhouette is within normal limits for size. The visualized bony structures of the thorax are intact. IMPRESSION: Question right parahilar airspace disease. Pneumonia could have this appearance. Follow-up chest x-ray in 4-6 weeks recommended to ensure resolution as neoplasm can present with similar imaging features. Electronically Signed   By: Misty Stanley M.D.   On: 06/12/2015 14:16   I have personally reviewed and evaluated these images and lab results as part of my medical decision-making. MDM   Final diagnoses:  CAP (community acquired pneumonia)    50 year old female presents with sore throat and nonproductive cough since Monday. Also reports subjective fever and bilateral ear pain. She notes coughing causes her head and abdomen to hurt. Patient is afebrile. TMs clear bilaterally. Posterior oropharynx with mild erythema. Patient handling her secretions without difficulty. Patient coughing on exam. Lungs clear to auscultation bilaterally. Abdomen soft, non-tender, non-distended. Patient given tessalon and ibuprofen in the ED. Will obtain rapid strep and CXR.    Rapid strep negative. Chest x-ray remarkable for right perihilar airspace disease, which could be consistent with pneumonia, follow-up chest radiograph 4-6 weeks recommended. Discussed findings with patient. Patient is non-toxic and well-appearing, no tachypnea or hypoxia. Will treat with azithromycin. Patient to follow up with PCP this week and in 4-6 weeks for repeat chest x-ray. Return precautions discussed. Patient verbalizes her understanding and is in agreement with plan.  BP 94/61 mmHg  Pulse 68  Temp(Src) 98.7 F (37.1 C) (Oral)  Resp 18  SpO2 96%  LMP 05/18/2015 (Exact Date)  I personally performed the services described in this documentation, which was scribed in my presence. The recorded information has been reviewed and is accurate.     [name], PA-C 06/12/15 [place], MD 06/12/15 2054

## 2015-06-12 NOTE — Discharge Instructions (Signed)
1. Medications: azithromycin, usual home medications 2. Treatment: rest, drink plenty of fluids; try warm honey, tea, throat lozenges for additional symptom relief 3. Follow Up: please followup with your primary doctor for discussion of your diagnoses and further evaluation after today's visit and for repeat chest x-ray in 4-6 weeks; if you do not have a primary care doctor use the phone number listed in your discharge paperwork to find one; please return to the ER for high fever, shortness of breath, new or worsening symptoms   Community-Acquired Pneumonia, Adult Pneumonia is an infection of the lungs. One type of pneumonia can happen while a person is in a hospital. A different type can happen when a person is not in a hospital (community-acquired pneumonia). It is easy for this kind to spread from person to person. It can spread to you if you breathe near an infected person who coughs or sneezes. Some symptoms include:  A dry cough.  A wet (productive) cough.  Fever.  Sweating.  Chest pain. HOME CARE  Take over-the-counter and prescription medicines only as told by your doctor.  Only take cough medicine if you are losing sleep.  If you were prescribed an antibiotic medicine, take it as told by your doctor. Do not stop taking the antibiotic even if you start to feel better.  Sleep with your head and neck raised (elevated). You can do this by putting a few pillows under your head, or you can sleep in a recliner.  Do not use tobacco products. These include cigarettes, chewing tobacco, and e-cigarettes. If you need help quitting, ask your doctor.  Drink enough water to keep your pee (urine) clear or pale yellow. A shot (vaccine) can help prevent pneumonia. Shots are often suggested for:  People older than 51 years of age.  People older than 51 years of age:  Who are having cancer treatment.  Who have long-term (chronic) lung disease.  Who have problems with their body's defense  system (immune system). You may also prevent pneumonia if you take these actions:  Get the flu (influenza) shot every year.  Go to the dentist as often as told.  Wash your hands often. If soap and water are not available, use hand sanitizer. GET HELP IF:  You have a fever.  You lose sleep because your cough medicine does not help. GET HELP RIGHT AWAY IF:  You are short of breath and it gets worse.  You have more chest pain.  Your sickness gets worse. This is very serious if:  You are an older adult.  Your body's defense system is weak.  You cough up blood.   This information is not intended to replace advice given to you by your health care provider. Make sure you discuss any questions you have with your health care provider.   Document Released: 09/06/2007 Document Revised: 12/09/2014 Document Reviewed: 07/15/2014 Elsevier Interactive Patient Education [employer].

## 2015-06-12 NOTE — ED Notes (Signed)
Pt reports sore that and non-productive cough that started on Monday, low grade fever on Wednesday - pt was seen at [facility] and had a negative strep and flu test at that time. Pt given rx for tessalon and naproxen w/o relief.

## 2015-06-13 ENCOUNTER — Encounter (HOSPITAL_COMMUNITY): Payer: Self-pay | Admitting: Emergency Medicine

## 2015-06-13 ENCOUNTER — Emergency Department (HOSPITAL_COMMUNITY)
Admission: EM | Admit: 2015-06-13 | Discharge: 2015-06-13 | Disposition: A | Discharge status: Home / Self Care | Payer: BLUE CROSS/BLUE SHIELD | Attending: Emergency Medicine | Admitting: Emergency Medicine

## 2015-06-13 DIAGNOSIS — R05 Cough: Secondary | ICD-10-CM | POA: Diagnosis not present

## 2015-06-13 DIAGNOSIS — R059 Cough, unspecified: Secondary | ICD-10-CM

## 2015-06-13 DIAGNOSIS — J029 Acute pharyngitis, unspecified: Secondary | ICD-10-CM | POA: Insufficient documentation

## 2015-06-13 DIAGNOSIS — Z87891 Personal history of nicotine dependence: Secondary | ICD-10-CM | POA: Diagnosis not present

## 2015-06-13 DIAGNOSIS — Z8619 Personal history of other infectious and parasitic diseases: Secondary | ICD-10-CM | POA: Diagnosis not present

## 2015-06-13 MED ORDER — HYDROCOD POLST-CPM POLST ER 10-8 MG/5ML PO SUER: 5.0000 mL | Freq: Every evening | ORAL | Status: DC | PRN

## 2015-06-13 MED ORDER — MAGIC MOUTHWASH
5.0000 mL | Freq: Once | ORAL | Status: AC
  Administered 2015-06-13: 5 mL via ORAL
  Filled 2015-06-13 (×2): qty 5

## 2015-06-13 MED ORDER — PREDNISONE 20 MG PO TABS: 40.0000 mg | ORAL_TABLET | Freq: Every day | ORAL | Status: DC

## 2015-06-13 MED ORDER — PREDNISONE 20 MG PO TABS
60.0000 mg | ORAL_TABLET | Freq: Once | ORAL | Status: AC
  Administered 2015-06-13: 60 mg via ORAL
  Filled 2015-06-13: qty 3

## 2015-06-13 MED ORDER — BENZONATATE 100 MG PO CAPS: 100.0000 mg | ORAL_CAPSULE | Freq: Once | ORAL | Status: DC

## 2015-06-13 MED ORDER — MAGIC MOUTHWASH W/LIDOCAINE: 5.0000 mL | Freq: Three times a day (TID) | ORAL | Status: DC | PRN

## 2015-06-13 MED ORDER — HYDROCOD POLST-CPM POLST ER 10-8 MG/5ML PO SUER
5.0000 mL | Freq: Once | ORAL | Status: AC
  Administered 2015-06-13: 5 mL via ORAL
  Filled 2015-06-13: qty 5

## 2015-06-13 NOTE — ED Notes (Signed)
Per patient, states was here for the same symptoms yesterday-states cough is worse

## 2015-06-13 NOTE — Discharge Instructions (Signed)
1. Medications: tussionex at nighttime for cough, prednisone, magic mouthwash for sore throat, usual home medications 2. Treatment: rest, drink plenty of fluids; continue to use warm honey, tea, throat lozenges for additional symptom relief 3. Follow Up: please followup with your primary doctor this week for discussion of your diagnoses and further evaluation after today's visit; please return to the ER for high fever, shortness of breath, new or worsening symptoms   Cough, Adult A cough helps to clear your throat and lungs. A cough may last only 2-3 weeks (acute), or it may last longer than 8 weeks (chronic). Many different things can cause a cough. A cough may be a sign of an illness or another medical condition. HOME CARE  Pay attention to any changes in your cough.  Take medicines only as told by your doctor.  If you were prescribed an antibiotic medicine, take it as told by your doctor. Do not stop taking it even if you start to feel better.  Talk with your doctor before you try using a cough medicine.  Drink enough fluid to keep your pee (urine) clear or pale yellow.  If the air is dry, use a cold steam vaporizer or humidifier in your home.  Stay away from things that make you cough at work or at home.  If your cough is worse at night, try using extra pillows to raise your head up higher while you sleep.  Do not smoke, and try not to be around smoke. If you need help quitting, ask your doctor.  Do not have caffeine.  Do not drink alcohol.  Rest as needed. GET HELP IF:  You have new problems (symptoms).  You cough up yellow fluid (pus).  Your cough does not get better after 2-3 weeks, or your cough gets worse.  Medicine does not help your cough and you are not sleeping well.  You have pain that gets worse or pain that is not helped with medicine.  You have a fever.  You are losing weight and you do not know why.  You have night sweats. GET HELP RIGHT AWAY  IF:  You cough up blood.  You have trouble breathing.  Your heartbeat is very fast.   This information is not intended to replace advice given to you by your health care provider. Make sure you discuss any questions you have with your health care provider.   Document Released: 12/01/2010 Document Revised: 12/09/2014 Document Reviewed: 05/27/2014 Elsevier Interactive Patient Education [employer].

## 2015-06-13 NOTE — ED Provider Notes (Signed)
CSN: [id number]     Arrival date & time 06/13/15  1549 History  By signing my name below, I, Jill Craig, attest that this documentation has been prepared under the direction and in the presence of Jill Chimes, PA-C. Electronically Signed: Hansel Craig, ED Scribe. 06/13/2015. 4:21 PM.    Chief Complaint  Patient presents with  . Cough    The history is provided by the patient. No language interpreter was used.     HPI Comments: Jill Craig is a 50 y.o. female who presents to the Emergency Department complaining of moderate, persistent cough initially onset 6 days ago and worsened today. Pt reports associated sore throat. She states she has been trying mucinex and tea with honey with no relief of symptoms. She reports that her sore throat is exacerbated by coughing. Pt was seen yesterday for the same symptoms. Her CXR findings were consistent with pneumonia and she was discharged with rx for azithromycin and tessalon. Rapid strep was negative. She reports that she has been taking her azithromycin and tessalon as prescribed with no relief of symptoms. She denies wheezing, SOB, emesis, nausea, abdominal pain, fever, chills.    Past Medical History  Diagnosis Date  . HSV infection 2004    R Buttock   History reviewed. No pertinent past surgical history. Family History  Problem Relation Age of Onset  . Prostate cancer Father   . Breast cancer Sister 82    Genetics negative   Social History  Substance Use Topics  . Smoking status: Former Smoker    Quit date: 11/02/1990  . Smokeless tobacco: None  . Alcohol Use: 8.4 oz/week    14 Glasses of wine per week   OB History    Gravida Para Term Preterm AB TAB SAB Ectopic Multiple Living   0 0 0 0 0 0 0 0 0 0       Review of Systems  Constitutional: Negative for fever and chills.  HENT: Positive for sore throat.   Respiratory: Positive for cough. Negative for shortness of breath and wheezing.   Gastrointestinal: Negative for  nausea and vomiting.    Allergies  Review of patient's allergies indicates no known allergies.  Home Medications   Prior to Admission medications   Medication Sig Start Date End Date Taking? Authorizing Provider  azithromycin (ZITHROMAX) 250 MG tablet Take 1 tablet (250 mg total) by mouth daily. Take first 2 tablets together, then 1 every day until finished. 06/12/15   Jill Chimes, PA-C  chlorpheniramine-HYDROcodone (TUSSIONEX PENNKINETIC ER) 10-8 MG/5ML SUER Take 5 mLs by mouth at bedtime as needed for cough. 06/13/15   Jill Chimes, PA-C  magic mouthwash w/lidocaine SOLN Take 5 mLs by mouth 3 (three) times daily as needed for mouth pain. 06/13/15   Jill Chimes, PA-C  predniSONE (DELTASONE) 20 MG tablet Take 2 tablets (40 mg total) by mouth daily. 06/13/15   Jill Chimes, PA-C  valACYclovir (VALTREX) 500 MG tablet Take 1 tablet (500 mg total) by mouth as needed. 08/14/12   [name], MD    BP 124/63 mmHg  Pulse 87  Temp(Src) 98.5 F (36.9 C) (Oral)  Resp 18  SpO2 97%  LMP 05/18/2015 (Exact Date) Physical Exam  Constitutional: She is oriented to person, place, and time. She appears well-developed and well-nourished. No distress.  HENT:  Head: Normocephalic and atraumatic.  Right Ear: External ear normal.  Left Ear: External ear normal.  Nose: Nose normal.  Mouth/Throat: Uvula is midline and  mucous membranes are normal. Posterior oropharyngeal erythema present. No oropharyngeal exudate, posterior oropharyngeal edema or tonsillar abscesses.  Mild erythema to posterior oropharynx.  Eyes: Conjunctivae, EOM and lids are normal. Pupils are equal, round, and reactive to light. Right eye exhibits no discharge. Left eye exhibits no discharge. No scleral icterus.  Neck: Normal range of motion. Neck supple.  Cardiovascular: Normal rate, regular rhythm, normal heart sounds, intact distal pulses and normal pulses.   Pulmonary/Chest: Effort normal and breath  sounds normal. No respiratory distress. She has no wheezes. She has no rales.  Abdominal: Soft. Normal appearance and bowel sounds are normal. She exhibits no distension and no mass. There is no tenderness. There is no rigidity, no rebound and no guarding.  Musculoskeletal: Normal range of motion. She exhibits no edema or tenderness.  Neurological: She is alert and oriented to person, place, and time. She has normal strength. No sensory deficit.  Skin: Skin is warm, dry and intact. No rash noted. She is not diaphoretic. No erythema. No pallor.  Psychiatric: She has a normal mood and affect. Her speech is normal and behavior is normal.  Nursing note and vitals reviewed.   ED Course  Procedures (including critical care time)  DIAGNOSTIC STUDIES: Oxygen Saturation is 97% on RA, normal by my interpretation.    COORDINATION OF CARE: 4:19 PM Discussed treatment plan with pt at bedside which includes symptomatic therapy and pt agreed to plan.    Imaging Review Dg Chest 2 View  06/12/2015  CLINICAL DATA:  Cough and fever EXAM: CHEST  2 VIEW COMPARISON:  None. FINDINGS: Question patchy right parahilar airspace disease. No edema or pleural effusion. The cardiopericardial silhouette is within normal limits for size. The visualized bony structures of the thorax are intact. IMPRESSION: Question right parahilar airspace disease. Pneumonia could have this appearance. Follow-up chest x-ray in 4-6 weeks recommended to ensure resolution as neoplasm can present with similar imaging features. Electronically Signed   By: Misty Stanley M.D.   On: 06/12/2015 14:16   I have personally reviewed and evaluated these images as part of my medical decision-making.   MDM   Final diagnoses:  Cough    50 year old female presents with persistent cough. Denies significant change in symptoms, though notes her cough has not improved, and causes her throat to feel sore. Patient is afebrile. Vital signs stable. No  tachypnea or hypoxia. Mild erythema to posterior oropharynx. Lungs clear to auscultation bilaterally. Do not feel additional imaging is indicated at this time. Will give cough medicine and steroid in the ED. On reassessment of patient, she reports symptom improvement. Patient is nontoxic and well-appearing, feel she is stable for discharge at this time. Will give tussionex, short steroid course, and magic mouthwash for home for symptoms. Patient to follow up with PCP. Return precautions discussed. Patient verbalizes her understanding and is in agreement with plan.  BP 124/63 mmHg  Pulse 87  Temp(Src) 98.5 F (36.9 C) (Oral)  Resp 18  SpO2 97%  LMP 05/18/2015 (Exact Date)   I personally performed the services described in this documentation, which was scribed in my presence. The recorded information has been reviewed and is accurate.   Jill Chimes, PA-C 06/13/15 1744  Charlesetta Shanks, MD 06/16/15 1310

## 2015-06-15 LAB — CULTURE, GROUP A STREP (THRC)

## 2015-09-06 ENCOUNTER — Ambulatory Visit (INDEPENDENT_AMBULATORY_CARE_PROVIDER_SITE_OTHER): Payer: BLUE CROSS/BLUE SHIELD | Admitting: Obstetrics & Gynecology

## 2015-09-06 ENCOUNTER — Emergency Department (HOSPITAL_COMMUNITY)
Admission: EM | Admit: 2015-09-06 | Discharge: 2015-09-06 | Disposition: A | Discharge status: Home / Self Care | Payer: BLUE CROSS/BLUE SHIELD | Attending: Emergency Medicine | Admitting: Emergency Medicine

## 2015-09-06 ENCOUNTER — Emergency Department (HOSPITAL_COMMUNITY): Payer: BLUE CROSS/BLUE SHIELD

## 2015-09-06 ENCOUNTER — Encounter (HOSPITAL_COMMUNITY): Payer: Self-pay | Admitting: Emergency Medicine

## 2015-09-06 ENCOUNTER — Encounter: Payer: Self-pay | Admitting: Obstetrics & Gynecology

## 2015-09-06 DIAGNOSIS — J189 Pneumonia, unspecified organism: Secondary | ICD-10-CM | POA: Diagnosis not present

## 2015-09-06 DIAGNOSIS — Z87891 Personal history of nicotine dependence: Secondary | ICD-10-CM | POA: Diagnosis not present

## 2015-09-06 DIAGNOSIS — R509 Fever, unspecified: Secondary | ICD-10-CM

## 2015-09-06 DIAGNOSIS — J069 Acute upper respiratory infection, unspecified: Secondary | ICD-10-CM | POA: Insufficient documentation

## 2015-09-06 DIAGNOSIS — J029 Acute pharyngitis, unspecified: Secondary | ICD-10-CM | POA: Diagnosis not present

## 2015-09-06 LAB — RAPID STREP SCREEN (MED CTR MEBANE ONLY): Streptococcus, Group A Screen (Direct): NEGATIVE

## 2015-09-06 MED ORDER — BENZONATATE 100 MG PO CAPS: 100.0000 mg | ORAL_CAPSULE | Freq: Three times a day (TID) | ORAL | Status: DC

## 2015-09-06 MED ORDER — AZITHROMYCIN 250 MG PO TABS: 250.0000 mg | ORAL_TABLET | Freq: Every day | ORAL | Status: DC

## 2015-09-06 NOTE — Discharge Instructions (Signed)

## 2015-09-06 NOTE — ED Provider Notes (Signed)
CSN: [id number]     Arrival date & time 09/06/15  1108 History  By signing my name below, I, Jill Craig, attest that this documentation has been prepared under the direction and in the presence of Lenn Sink, PA-C Electronically Signed: Soijett Craig, ED Scribe. 09/06/2015. 1:03 PM.   Chief Complaint  Patient presents with  . Sore Throat  . Fever     The history is provided by the patient. No language interpreter was used.    HPI Comments: Jill Craig is a 50 y.o. female who presents to the Emergency Department complaining of sore throat onset 2 days. Pt reports that 2-3 months ago she was dx with pneumonia. Pt notes that she feels the same way as she did when dx with pneumonia. Pt states that her symptoms resolved when she was seen in the hospital. She states that she is having associated symptoms of fever, chills, cough, and  HA. She states that she has tried dayquil with her last dose being 8 am this morning for the relief for her symptoms. She denies SOB and any other symptoms. Denies smoking cigarettes or PMHx of asthma. Pt is not currently working at this time.   Per pt chart review: Pt was seen in the ED on 06/12/2015 for sore throat and cough. Pt had CXR imaging completed with results of right perihilar airspace disease. Pt was Rx azithromycin for their symptoms.   Past Medical History  Diagnosis Date  . HSV infection 2004    R Buttock   No past surgical history on file. Family History  Problem Relation Age of Onset  . Prostate cancer Father   . Breast cancer Sister 68    Genetics negative   Social History  Substance Use Topics  . Smoking status: Former Smoker    Quit date: 11/02/1990  . Smokeless tobacco: None  . Alcohol Use: 8.4 oz/week    14 Glasses of wine per week   OB History    Gravida Para Term Preterm AB TAB SAB Ectopic Multiple Living   0 0 0 0 0 0 0 0 0 0      Review of Systems  Constitutional: Positive for fever and chills.  HENT: Positive for sore  throat.   Respiratory: Positive for cough. Negative for shortness of breath.   Neurological: Positive for headaches.      Allergies  Review of patient's allergies indicates no known allergies.  Home Medications   Prior to Admission medications   Medication Sig Start Date End Date Taking? Authorizing Provider  azithromycin (ZITHROMAX) 250 MG tablet Take 1 tablet (250 mg total) by mouth daily. Take first 2 tablets together, then 1 every day until finished. 09/06/15   Okey Regal, PA-C  benzonatate (TESSALON) 100 MG capsule Take 1 capsule (100 mg total) by mouth every 8 (eight) hours. 09/06/15   Okey Regal, PA-C  chlorpheniramine-HYDROcodone (TUSSIONEX PENNKINETIC ER) 10-8 MG/5ML SUER Take 5 mLs by mouth at bedtime as needed for cough. 06/13/15   [name], PA-C  magic mouthwash w/lidocaine SOLN Take 5 mLs by mouth 3 (three) times daily as needed for mouth pain. 06/13/15   [name], PA-C  predniSONE (DELTASONE) 20 MG tablet Take 2 tablets (40 mg total) by mouth daily. 06/13/15   [name], PA-C  valACYclovir (VALTREX) 500 MG tablet Take 1 tablet (500 mg total) by mouth as needed. 08/14/12   [name], MD   BP 104/62 mmHg  Pulse 68  Temp(Src) 100.1 F (37.8  C) (Oral)  Resp 18  SpO2 100%  LMP 08/11/2015 Physical Exam  Constitutional: She is oriented to person, place, and time. She appears well-developed and well-nourished. No distress.  HENT:  Head: Normocephalic and atraumatic.  Right Ear: Tympanic membrane, external ear and ear canal normal.  Left Ear: Tympanic membrane, external ear and ear canal normal.  Mouth/Throat: Uvula is midline, oropharynx is clear and moist and mucous membranes are normal.  Eyes: EOM are normal.  Neck: Neck supple.  Cardiovascular: Normal rate, regular rhythm and normal heart sounds.  Exam reveals no gallop and no friction rub.   No murmur heard. Pulmonary/Chest: Effort normal and breath sounds normal. No respiratory  distress. She has no wheezes. She has no rales.  Abdominal: Soft. She exhibits no distension. There is no tenderness.  Musculoskeletal: Normal range of motion.  Neurological: She is alert and oriented to person, place, and time.  Skin: Skin is warm and dry.  Psychiatric: She has a normal mood and affect. Her behavior is normal.  Nursing note and vitals reviewed.   ED Course  Procedures (including critical care time) DIAGNOSTIC STUDIES: Oxygen Saturation is 100% on RA, nl by my interpretation.    COORDINATION OF CARE: 1:03 PM Discussed treatment plan with pt at bedside which includes rapid strep screen and culture and CXR and pt agreed to plan.    Labs Review Labs Reviewed  RAPID STREP SCREEN (NOT AT [facility])  CULTURE, GROUP A STREP [facility])    Imaging Review Dg Chest 2 View  09/06/2015  CLINICAL DATA:  Complaining of sore throat onset 2 days. Pt reports that 2-3 months ago she was dx with pneumonia. Pt notes that she feels the same way as she did when dx with pneumonia. EXAM: CHEST  2 VIEW COMPARISON:  06/12/2015 FINDINGS: The heart size and mediastinal contours are within normal limits. Both lungs are clear. The visualized skeletal structures are unremarkable. IMPRESSION: No active cardiopulmonary disease. Electronically Signed   By: Kathreen Devoid   On: 09/06/2015 13:55   I have personally reviewed and evaluated these images and lab results as part of my medical decision-making.   EKG Interpretation None      MDM   Final diagnoses:  URI (upper respiratory infection)   Labs: rapid strep screen and culture  Imaging: CXR: IMPRESSION: No active cardiopulmonary disease.   Consults:   Therapeutics:   Discharge Meds: azithromycin Rx and tessalon perles Rx to began in 2 days if symptoms persist  Assessment/Plan: Pt with likely viral URI. No bacterial infection present. Non-toxic, lung sounds clear. Symptomatic care instructions given. Given strict return precautions in the event  new or worsening symptoms present. Pt verbalized understanding and agreement to today's plan and had no further questions or concerns at this time.  I personally performed the services described in this documentation, which was scribed in my presence. The recorded information has been reviewed and is accurate.   Okey Regal, PA-C 09/06/15 1453  Charlesetta Shanks, MD 09/06/15 [phone]

## 2015-09-06 NOTE — Progress Notes (Signed)
Patient was here for an annual exam, upon getting vitals, patient informed me that she was sick. Was diagnosed and treated with pneumonia about 55months ago. Still not feeling well with cough and sore throat as per patient.   Temp - 101.3 BP - 90/62 Pulse - 96 Resp - 18  Patient was advised to rescheduled appointment and seek an Urgent Care. Patient will call back to reschedule appointment.

## 2015-09-06 NOTE — ED Notes (Signed)
Pt c/o sore throat, fever, cough since Saturday.  Denies other symptoms.

## 2015-09-08 DIAGNOSIS — J209 Acute bronchitis, unspecified: Secondary | ICD-10-CM | POA: Diagnosis not present

## 2015-09-08 LAB — CULTURE, GROUP A STREP (THRC)

## 2015-09-17 DIAGNOSIS — J209 Acute bronchitis, unspecified: Secondary | ICD-10-CM | POA: Diagnosis not present

## 2016-01-10 ENCOUNTER — Ambulatory Visit (INDEPENDENT_AMBULATORY_CARE_PROVIDER_SITE_OTHER): Payer: BLUE CROSS/BLUE SHIELD | Admitting: Obstetrics & Gynecology

## 2016-01-10 ENCOUNTER — Encounter: Payer: Self-pay | Admitting: Obstetrics & Gynecology

## 2016-01-10 VITALS — BP 104/60 | HR 64 | Resp 12 | Ht 62.0 in | Wt 133.0 lb

## 2016-01-10 DIAGNOSIS — Z1211 Encounter for screening for malignant neoplasm of colon: Secondary | ICD-10-CM

## 2016-01-10 DIAGNOSIS — Z0000 Encounter for general adult medical examination without abnormal findings: Secondary | ICD-10-CM | POA: Diagnosis not present

## 2016-01-10 DIAGNOSIS — Z124 Encounter for screening for malignant neoplasm of cervix: Secondary | ICD-10-CM | POA: Diagnosis not present

## 2016-01-10 DIAGNOSIS — Z01419 Encounter for gynecological examination (general) (routine) without abnormal findings: Secondary | ICD-10-CM | POA: Diagnosis not present

## 2016-01-10 DIAGNOSIS — N841 Polyp of cervix uteri: Secondary | ICD-10-CM

## 2016-01-10 DIAGNOSIS — Z1151 Encounter for screening for human papillomavirus (HPV): Secondary | ICD-10-CM | POA: Diagnosis not present

## 2016-01-10 DIAGNOSIS — N938 Other specified abnormal uterine and vaginal bleeding: Secondary | ICD-10-CM | POA: Diagnosis not present

## 2016-01-10 DIAGNOSIS — N72 Inflammatory disease of cervix uteri: Secondary | ICD-10-CM | POA: Diagnosis not present

## 2016-01-10 LAB — POCT URINALYSIS DIPSTICK
Bilirubin, UA: NEGATIVE
Blood, UA: NEGATIVE
Glucose, UA: NEGATIVE
KETONES UA: NEGATIVE
LEUKOCYTES UA: NEGATIVE
Nitrite, UA: NEGATIVE
PROTEIN UA: NEGATIVE
Urobilinogen, UA: NEGATIVE
pH, UA: 5

## 2016-01-10 LAB — COMPREHENSIVE METABOLIC PANEL
ALBUMIN: 4.6 g/dL (ref 3.6–5.1)
ALT: 10 U/L (ref 6–29)
AST: 19 U/L (ref 10–35)
Alkaline Phosphatase: 56 U/L (ref 33–130)
BILIRUBIN TOTAL: 0.5 mg/dL (ref 0.2–1.2)
BUN: 11 mg/dL (ref 7–25)
CO2: 26 mmol/L (ref 20–31)
CREATININE: 0.71 mg/dL (ref 0.50–1.05)
Calcium: 9.7 mg/dL (ref 8.6–10.4)
Chloride: 98 mmol/L (ref 98–110)
Glucose, Bld: 71 mg/dL (ref 65–99)
Potassium: 4.3 mmol/L (ref 3.5–5.3)
SODIUM: 137 mmol/L (ref 135–146)
TOTAL PROTEIN: 7.9 g/dL (ref 6.1–8.1)

## 2016-01-10 LAB — CBC
HCT: 42.2 % (ref 35.0–45.0)
HEMOGLOBIN: 13.7 g/dL (ref 11.7–15.5)
MCH: 29.7 pg (ref 27.0–33.0)
MCHC: 32.5 g/dL (ref 32.0–36.0)
MCV: 91.5 fL (ref 80.0–100.0)
MPV: 11.9 fL (ref 7.5–12.5)
Platelets: 246 10*3/uL (ref 140–400)
RBC: 4.61 MIL/uL (ref 3.80–5.10)
RDW: 13.7 % (ref 11.0–15.0)
WBC: 8.1 10*3/uL (ref 3.8–10.8)

## 2016-01-10 LAB — LIPID PANEL
CHOLESTEROL: 246 mg/dL — AB (ref 125–200)
HDL: 87 mg/dL (ref >=46)
LDL Cholesterol: 138 mg/dL — ABNORMAL HIGH (ref <130)
TRIGLYCERIDES: 106 mg/dL (ref <150)
Total CHOL/HDL Ratio: 2.8 Ratio (ref <=5.0)
VLDL: 21 mg/dL (ref <30)

## 2016-01-10 LAB — TSH: TSH: 3.01 mIU/L

## 2016-01-10 NOTE — Progress Notes (Signed)
50 y.o. G0P0000 MarriedHispanicF here for annual exam.  Pt frustrated with her ability to get scheduled for her appt.    Cycles are changing.  Skipped cycle last year for about six months.  Having cycles monthly but several times this months, she then will turn around and have another cycle.  Flow is not heavy.    Traveling a lot for work.  Doing a lot of international travel this year--[place], [place], [place].     [name] last menstrual period was 12/29/2015.          Sexually active: No.  The current method of family planning is abstinence.    Exercising: No.  The patient does not participate in regular exercise at present. Smoker:  no  Health Maintenance: Pap:  03/03/10 Normal  History of abnormal Pap:  no MMG:  03/08/15 BIRADS1:neg  Colonoscopy:  None BMD:   None TDaP:  2008  Pneumonia vaccine(s):  No Zostavax:   No  Hep C testing: No Screening Labs: Here, Hb today: 13.2, Urine today: Negative   reports that she quit smoking about 25 years ago. She has never used smokeless tobacco. She reports that she drinks about 8.4 oz of alcohol per week . She reports that she does not use drugs.  Past Medical History:  Diagnosis Date  . HSV infection 2004   R Buttock    History reviewed. No pertinent surgical history.  Current Outpatient Prescriptions  Medication Sig Dispense Refill  . valACYclovir (VALTREX) 500 MG tablet Take 1 tablet (500 mg total) by mouth as needed. 30 tablet 6   No current facility-administered medications for this visit.     Family History  Problem Relation Age of Onset  . Prostate cancer Father   . Breast cancer Sister 52    Genetics negative    ROS:  Pertinent items are noted in HPI.  Otherwise, a comprehensive ROS was negative.  Exam:   BP 104/60 (BP Location: Right Arm, Patient Position: Sitting, Cuff Size: Normal)   Pulse 64   Resp 12   Ht 5\' 2"  (1.575 m)   Wt 133 lb (60.3 kg)   LMP 12/29/2015   BMI 24.33 kg/m   Weight change: -2#    Height:  5\' 2"  (157.5 cm)  Ht Readings from Last 3 Encounters:  01/10/16 5\' 2"  (1.575 m)  06/06/13 5' 3.5" (1.613 m)  03/14/13 5' 3.5" (1.613 m)    General appearance: alert, cooperative and appears stated age Head: Normocephalic, without obvious abnormality, atraumatic Neck: no adenopathy, supple, symmetrical, trachea midline and thyroid normal to inspection and palpation Lungs: clear to auscultation bilaterally Breasts: normal appearance, no masses or tenderness Heart: regular rate and rhythm Abdomen: soft, non-tender; bowel sounds normal; no masses,  no organomegaly Extremities: extremities normal, atraumatic, no cyanosis or edema Skin: Skin color, texture, turgor normal. No rashes or lesions Lymph nodes: Cervical, supraclavicular, and axillary nodes normal. No abnormal inguinal nodes palpated Neurologic: Grossly normal   Pelvic: External genitalia:  no lesions              Urethra:  normal appearing urethra with no masses, tenderness or lesions              Bartholins and Skenes: normal                 Vagina: normal appearing vagina with normal color and discharge, no lesions              Cervix: no lesions, prolapsed polyp  noted              Pap taken: Yes.   Bimanual Exam:  Uterus:  normal size, contour, position, consistency, mobility, non-tender              Adnexa: normal adnexa and no mass, fullness, tenderness               Rectovaginal: Confirms               Anus:  normal sphincter tone, no lesions  D/W pt polyp removal.  Pt gave verbal consent.  Polyp grasped with ringed forceps and twisted at base until removed.  Silver nitrate used for hemostasis.  Pt tolerted procedure well.  Instruments removed.  Chaperone was present for exam.  A:  Well Woman with normal exam Perimenopausal Increased work travel and stressors Fatigue Cervical polyp  P:   Mammogram guidelines.  D/W 3D guildelines pap smear and HR HPV obtained today CBC, Lipids, TSH, Vit D, CMP FSH obtained  today Referral to GI for screening colonoscopy Pt aware Tdap due next year D/w pt IUD use for perimenopausal bleeding.  Pt is considering.  Will precert at well. return annually or prn

## 2016-01-11 LAB — VITAMIN D 25 HYDROXY (VIT D DEFICIENCY, FRACTURES): Vit D, 25-Hydroxy: 26 ng/mL — ABNORMAL LOW (ref 30–100)

## 2016-01-11 LAB — FOLLICLE STIMULATING HORMONE: FSH: 33 m[IU]/mL

## 2016-01-12 ENCOUNTER — Other Ambulatory Visit: Payer: Self-pay | Admitting: *Deleted

## 2016-01-12 LAB — IPS PAP TEST WITH HPV

## 2016-01-12 LAB — IPS OTHER TISSUE BIOPSY

## 2016-01-12 MED ORDER — VITAMIN D (ERGOCALCIFEROL) 1.25 MG (50000 UNIT) PO CAPS: 50000.0000 [IU] | ORAL_CAPSULE | ORAL | 0 refills | Status: DC

## 2016-01-12 NOTE — Progress Notes (Signed)
Vit D sent to pharmacy. See lab result from 01/12/16

## 2016-01-13 LAB — HEMOGLOBIN, FINGERSTICK: Hemoglobin, fingerstick: 13.7 g/dL (ref 12.0–16.0)

## 2016-02-08 DIAGNOSIS — Z1211 Encounter for screening for malignant neoplasm of colon: Secondary | ICD-10-CM | POA: Diagnosis not present

## 2016-02-29 DIAGNOSIS — R350 Frequency of micturition: Secondary | ICD-10-CM | POA: Diagnosis not present

## 2016-02-29 DIAGNOSIS — J029 Acute pharyngitis, unspecified: Secondary | ICD-10-CM | POA: Diagnosis not present

## 2016-07-17 ENCOUNTER — Encounter: Payer: Self-pay | Admitting: Obstetrics & Gynecology

## 2016-07-17 ENCOUNTER — Ambulatory Visit (INDEPENDENT_AMBULATORY_CARE_PROVIDER_SITE_OTHER): Payer: BLUE CROSS/BLUE SHIELD | Admitting: Obstetrics & Gynecology

## 2016-07-17 ENCOUNTER — Telehealth: Payer: Self-pay | Admitting: Obstetrics & Gynecology

## 2016-07-17 VITALS — BP 100/60 | HR 60 | Resp 12 | Ht 62.0 in | Wt 139.4 lb

## 2016-07-17 DIAGNOSIS — N644 Mastodynia: Secondary | ICD-10-CM | POA: Diagnosis not present

## 2016-07-17 NOTE — Progress Notes (Signed)
GYNECOLOGY  VISIT   HPI: 51 y.o. G54P0000 Married Caucasian female here for left breast mass that she noted this weekend.  She feels it is tender.  She just started her cycle this weekend as well.  She went five months without a cycle--from October to March.  Cycle in march was 2 or 3.  Bleeding is the same with this cycle as well.    Denies breast trauma or nipple discharge.    Last MMG 03/08/15--normal.  Doesn't really want to have yearly MMG despite my recommendations regarding this.  GYNECOLOGIC HISTORY: [name] last menstrual period was 07/15/2016. Contraception: abstinance Menopausal hormone therapy: none  Patient Active Problem List   Diagnosis Date Noted  . Fibrocystic breast disease 06/06/2013  . Low back pain radiating to both legs 11/02/2010    Past Medical History:  Diagnosis Date  . HSV infection 2004   R Buttock    No past surgical history on file.  MEDS:  Reviewed in EPIC and UTD  ALLERGIES: Patient has no known allergies.  Family History  Problem Relation Age of Onset  . Prostate cancer Father   . Breast cancer Sister 52    Genetics negative    SH:  Married, non smoker  Review of Systems  All other systems reviewed and are negative.   PHYSICAL EXAMINATION:    BP 100/60 (BP Location: Right Arm, Patient Position: Sitting, Cuff Size: Normal)   Pulse 60   Resp 12   Ht 5\' 2"  (1.575 m)   Wt 139 lb 6.4 oz (63.2 kg)   LMP 07/15/2016   BMI 25.50 kg/m     Physical Exam  Constitutional: She appears well-developed and well-nourished.  Neck: Normal range of motion. Neck supple. No thyromegaly present.  Cardiovascular: Normal rate and regular rhythm.   Respiratory: Effort normal and breath sounds normal. Right breast exhibits no inverted nipple, no mass, no nipple discharge, no skin change and no tenderness. Left breast exhibits tenderness. Left breast exhibits no inverted nipple, no mass, no nipple discharge and no skin change. Breasts are symmetrical.     Lymphadenopathy:    She has no cervical adenopathy.   Assessment: Left breast pain, no discrete mass Overdue mammogram Oligo ovulation in the past year  Plan: With no discrete mass, suspect this is related to recent cycle.  Recommended follow up 4 weeks for reassessment but pt ONLY wants to call back if pain persists.  Declines follow up for now.  Also, declines going for mammogram unless symptoms persist.  Will call if need referral for additional imaging.  Advised MMG is overdue anyway right now but she declines.  Knows if pain persists, to call and diagnostic imaging will be planned.

## 2016-07-17 NOTE — Telephone Encounter (Signed)
Patient called requesting an appointment with Dr. Sabra Heck today. She said she has a new lump in her left breast.  Last seen: 01/10/16

## 2016-07-17 NOTE — Telephone Encounter (Signed)
Spoke with patient. Patient requesting appointment with Dr. Sabra Heck for today for evaluation of lump in left breast. Patient states she noticed lump in left breast this weekend and reports as tender. Denies any drainage, redness or swelling. LMP 07/15/16. Patient scheduled for today at 3:45pm with Dr. Sabra Heck for further evaluation. Patient is agreeable to date and time.   Routing to provider for final review. Patient is agreeable to disposition. Will close encounter.

## 2016-10-01 ENCOUNTER — Emergency Department (HOSPITAL_COMMUNITY)
Admission: EM | Admit: 2016-10-01 | Discharge: 2016-10-02 | Disposition: A | Discharge status: Home / Self Care | Payer: BLUE CROSS/BLUE SHIELD | Attending: Emergency Medicine | Admitting: Emergency Medicine

## 2016-10-01 ENCOUNTER — Encounter (HOSPITAL_COMMUNITY): Payer: Self-pay | Admitting: Emergency Medicine

## 2016-10-01 DIAGNOSIS — F17210 Nicotine dependence, cigarettes, uncomplicated: Secondary | ICD-10-CM | POA: Diagnosis not present

## 2016-10-01 DIAGNOSIS — R079 Chest pain, unspecified: Secondary | ICD-10-CM | POA: Diagnosis not present

## 2016-10-01 DIAGNOSIS — R072 Precordial pain: Secondary | ICD-10-CM

## 2016-10-01 NOTE — ED Triage Notes (Signed)
C/o squeezing pain to center of chest and nausea that woke her up 20 min ago.  Denies sob, diaphoresis.

## 2016-10-02 ENCOUNTER — Emergency Department (HOSPITAL_COMMUNITY): Payer: BLUE CROSS/BLUE SHIELD

## 2016-10-02 DIAGNOSIS — R079 Chest pain, unspecified: Secondary | ICD-10-CM | POA: Diagnosis not present

## 2016-10-02 LAB — BASIC METABOLIC PANEL
ANION GAP: 8 (ref 5–15)
BUN: 21 mg/dL — ABNORMAL HIGH (ref 6–20)
CALCIUM: 9.7 mg/dL (ref 8.9–10.3)
CO2: 28 mmol/L (ref 22–32)
CREATININE: 0.76 mg/dL (ref 0.44–1.00)
Chloride: 101 mmol/L (ref 101–111)
GFR calc Af Amer: >60 mL/min (ref >60)
GLUCOSE: 103 mg/dL — AB (ref 65–99)
Potassium: 3.6 mmol/L (ref 3.5–5.1)
Sodium: 137 mmol/L (ref 135–145)

## 2016-10-02 LAB — CBC
HCT: 40.5 % (ref 36.0–46.0)
Hemoglobin: 13.3 g/dL (ref 12.0–15.0)
MCH: 30.2 pg (ref 26.0–34.0)
MCHC: 32.8 g/dL (ref 30.0–36.0)
MCV: 92 fL (ref 78.0–100.0)
PLATELETS: 221 10*3/uL (ref 150–400)
RBC: 4.4 MIL/uL (ref 3.87–5.11)
RDW: 13.5 % (ref 11.5–15.5)
WBC: 10.2 10*3/uL (ref 4.0–10.5)

## 2016-10-02 LAB — I-STAT TROPONIN, ED: TROPONIN I, POC: 0 ng/mL (ref 0.00–0.08)

## 2016-10-02 NOTE — ED Notes (Signed)
Patient transported to X-ray 

## 2016-10-02 NOTE — Discharge Instructions (Signed)

## 2016-10-02 NOTE — ED Notes (Signed)
Pt and family understood dc material. NAD noted 

## 2016-10-02 NOTE — ED Provider Notes (Signed)
[place] DEPT Provider Note   CSN: [id number] Arrival date & time: 10/01/16  2336     History   Chief Complaint Chief Complaint  Patient presents with  . Chest Pain    HPI Jill Craig is a 51 y.o. female.  The history is provided by the patient and the spouse.  Chest Pain   This is a new problem. The current episode started less than 1 hour ago. The problem occurs constantly. The problem has been resolved. Associated with: sleeping. The pain is present in the substernal region. The pain is moderate. The quality of the pain is described as pressure-like. The pain does not radiate. Duration of episode(s) is 30 minutes. Associated symptoms include nausea. Pertinent negatives include no diaphoresis, no fever, no lower extremity edema, no shortness of breath and no vomiting. She has tried nothing for the symptoms. Risk factors include smoking/tobacco exposure.  Pertinent negatives for past medical history include no CAD and no PE.  Pertinent negatives for family medical history include: no CAD.  Patient reports she woke up with chest pressure that lasted 30 minutes She reports nausea and then she had a large bowel movement No vomiting She is now back to baseline She has never had this before She felt well prior to going to bed and last week No h/o CAD/PE No recent travel She smokes intermittently No fam h/o CAD She is now requesting d/c home   Past Medical History:  Diagnosis Date  . HSV infection 2004   R Buttock    Patient Active Problem List   Diagnosis Date Noted  . Fibrocystic breast disease 06/06/2013  . Low back pain radiating to both legs 11/02/2010    History reviewed. No pertinent surgical history.  OB History    Gravida Para Term Preterm AB Living   0 0 0 0 0 0   SAB TAB Ectopic Multiple Live Births   0 0 0 0         Home Medications    Prior to Admission medications   Medication Sig Start Date End Date Taking? Authorizing Provider    valACYclovir (VALTREX) 500 MG tablet Take 500 mg by mouth as needed.    [provider]    Family History Family History  Problem Relation Age of Onset  . Prostate cancer Father   . Breast cancer Sister 10       Genetics negative    Social History Social History  Substance Use Topics  . Smoking status: Current Every Day Smoker  . Smokeless tobacco: Never Used  . Alcohol use 8.4 oz/week    14 Glasses of wine per week     Allergies   Patient has no known allergies.   Review of Systems Review of Systems  Constitutional: Negative for diaphoresis and fever.  Respiratory: Negative for shortness of breath.   Cardiovascular: Positive for chest pain. Negative for leg swelling.  Gastrointestinal: Positive for nausea. Negative for vomiting.  Neurological: Negative for syncope.     Physical Exam Updated Vital Signs BP 102/65   Pulse 75   Temp 98.6 F (37 C) (Oral)   Resp 13   Ht 1.549 m (5\' 1" )   Wt 59 kg (130 lb)   LMP 09/17/2016   SpO2 99%   BMI 24.56 kg/m   Physical Exam CONSTITUTIONAL: Well developed/well nourished HEAD: Normocephalic/atraumatic EYES: EOMI/PERRL ENMT: Mucous membranes moist NECK: supple no meningeal signs SPINE/BACK:entire spine nontender CV: S1/S2 noted, no murmurs/rubs/gallops noted LUNGS: Lungs are clear  to auscultation bilaterally, no apparent distress ABDOMEN: soft, nontender, no rebound or guarding, bowel sounds noted throughout abdomen GU:no cva tenderness NEURO: Pt is awake/alert/appropriate, moves all extremitiesx4.  No facial droop.   EXTREMITIES: pulses normal/equal, full ROM, no LE edema or tenderness SKIN: warm, color normal PSYCH: no abnormalities of mood noted, alert and oriented to situation   ED Treatments / Results  Labs (all labs ordered are listed, but only abnormal results are displayed) Labs Reviewed  BASIC METABOLIC PANEL - Abnormal; Notable for the following:       Result Value   Glucose, Bld 103 (*)     BUN 21 (*)    All other components within normal limits  CBC  I-STAT TROPOININ, ED    EKG  EKG Interpretation  Date/Time:  Sunday October 01 2016 23:41:04 EDT Ventricular Rate:  69 PR Interval:  150 QRS Duration: 74 QT Interval:  358 QTC Calculation: 383 R Axis:   85 Text Interpretation:  Normal sinus rhythm with sinus arrhythmia Cannot rule out Anterior infarct , age undetermined Abnormal ECG No previous ECGs available Confirmed by Ripley Fraise [phone]) on 10/01/2016 11:52:42 PM       Radiology Dg Chest 2 View  Result Date: 10/02/2016 CLINICAL DATA:  Squeezing pain to center of chest. EXAM: CHEST  2 VIEW COMPARISON:  September 06, 2015 FINDINGS: The heart size and mediastinal contours are within normal limits. Both lungs are clear. The visualized skeletal structures are unremarkable. IMPRESSION: No active cardiopulmonary disease. Electronically Signed   By: Dorise Bullion III M.D   On: 10/02/2016 00:19    Procedures Procedures    Medications Ordered in ED Medications - No data to display   Initial Impression / Assessment and Plan / ED Course  I have reviewed the triage vital signs and the nursing notes.  Pertinent labs & imaging results that were available during my care of the patient were reviewed by me and considered in my medical decision making (see chart for details).     Pt stable, well appearing, very healthy for her age other than smoking  With age/smoking history/EKG with NSST changes, HEART score = 4 After long discussion with patient/husband, she prefers to go home I discussed extensively that we can not r/o ACS with one troponin/ekg She may even need admitted However after much discussion, they understand risks of going and accept those risks She is awake/alert, appropriate able to make her own decisions Will d/c home We discussed strict ER return precautions  I have low suspicion for PE/Dissection at this time Final Clinical Impressions(s) / ED Diagnoses    Final diagnoses:  Precordial pain    New Prescriptions Discharge Medication List as of 10/02/2016  1:03 AM       Ripley Fraise, MD 10/02/16 0128

## 2016-11-22 ENCOUNTER — Encounter: Payer: Self-pay | Admitting: Cardiovascular Disease

## 2016-11-22 ENCOUNTER — Ambulatory Visit (INDEPENDENT_AMBULATORY_CARE_PROVIDER_SITE_OTHER): Payer: BLUE CROSS/BLUE SHIELD | Admitting: Cardiovascular Disease

## 2016-11-22 VITALS — BP 90/60 | HR 47 | Ht 62.0 in | Wt 137.2 lb

## 2016-11-22 DIAGNOSIS — R0789 Other chest pain: Secondary | ICD-10-CM | POA: Diagnosis not present

## 2016-11-22 DIAGNOSIS — R002 Palpitations: Secondary | ICD-10-CM | POA: Diagnosis not present

## 2016-11-22 DIAGNOSIS — R42 Dizziness and giddiness: Secondary | ICD-10-CM | POA: Diagnosis not present

## 2016-11-22 NOTE — Assessment & Plan Note (Signed)
Ms. Hinderliter was seen in the emergency room on 10/01/16 for evaluation of an episode of atypical chest pain that awakened her from sleep. The pain ultimately resolved. There were no other associated findings. She ruled out for myocardial infarction. Her EKG showed no acute changes. She has no cardiac risk factors. Since that episode she still noticed persistent fatigue and episodic dyspnea. Am going to get a 2-D echocardiogram and a routine GXT to further evaluate.

## 2016-11-22 NOTE — Patient Instructions (Signed)
Medication Instructions: Your physician recommends that you continue on your current medications as directed. Please refer to the Current Medication list given to you today.   Testing/Procedures: Your physician has requested that you have an echocardiogram. Echocardiography is a painless test that uses sound waves to create images of your heart. It provides your doctor with information about the size and shape of your heart and how well your heart's chambers and valves are working. This procedure takes approximately one hour. There are no restrictions for this procedure.  Your physician has requested that you have an exercise tolerance test. For further information please visit [email or web address]. Please also follow instruction sheet, as given.  Your physician has recommended that you wear a 30 day event monitor. Event monitors are medical devices that record the heart's electrical activity. Doctors most often [place] these monitors to diagnose arrhythmias. Arrhythmias are problems with the speed or rhythm of the heartbeat. The monitor is a small, portable device. You can wear one while you do your normal daily activities. This is usually used to diagnose what is causing palpitations/syncope (passing out).  Follow-Up: Your physician recommends that you schedule a follow-up appointment after testing with Dr. Gwenlyn Found.  If you need a refill on your cardiac medications before your next appointment, please call your pharmacy.

## 2016-11-22 NOTE — Progress Notes (Signed)
11/22/2016 Latasia Silberstein   06-09-1965  [id number]  Primary Physician Patient, No Pcp Per Primary Cardiologist: Lorretta Harp MD Renae Gloss  HPI:  Jill Craig is a 51 y.o. female married with no children and works as a Gaffer here in [place]. She was referred by the emergency room for evaluation of a recent episode of atypical chest pain. Ms. Stagliano has no cardiac risk factors other than occasional low-frequency tobacco abuse. She was awakened from sleep on 10/01/16 and went to the emergency room where her workup was unrevealing. The pain ultimately resolved. Her EKG showed no acute changes and her troponins were negative. Since that time she's noticed excessive fatigue and episodic dyspnea as well as tachypalpitations and occasional dizziness.   Current Meds  Medication Sig  . valACYclovir (VALTREX) 500 MG tablet Take 500 mg by mouth as needed.     No Known Allergies  Social History   Social History  . Marital status: Married    Spouse name: N/A  . Number of children: N/A  . Years of education: N/A   Occupational History  . Not on file.   Social History Main Topics  . Smoking status: Current Every Day Smoker  . Smokeless tobacco: Never Used  . Alcohol use 8.4 oz/week    14 Glasses of wine per week  . Drug use: No     Comment: husband is infertile  . Sexual activity: Yes    Birth control/ protection: Other-see comments   Other Topics Concern  . Not on file   Social History Narrative   Caffienated drinks-yes   Seat belt use often-yes   Regular Exercise-yes   Smoke alarm in the home-no   Firearms/guns in the home-yes   History of physical abuse-no                 Review of Systems: General: negative for chills, fever, night sweats or weight changes.  Cardiovascular: negative for chest pain, dyspnea on exertion, edema, orthopnea, palpitations, paroxysmal nocturnal dyspnea or shortness of breath Dermatological:  negative for rash Respiratory: negative for cough or wheezing Urologic: negative for hematuria Abdominal: negative for nausea, vomiting, diarrhea, bright red blood per rectum, melena, or hematemesis Neurologic: negative for visual changes, syncope, or dizziness All other systems reviewed and are otherwise negative except as noted above.    Blood pressure 90/60, pulse (!) 47, height 5\' 2"  (1.575 m), weight 137 lb 3.2 oz (62.2 kg).  General appearance: alert and no distress Neck: no adenopathy, no carotid bruit, no JVD, supple, symmetrical, trachea midline and thyroid not enlarged, symmetric, no tenderness/mass/nodules Lungs: clear to auscultation bilaterally Heart: regular rate and rhythm, S1, S2 normal, no murmur, click, rub or gallop Extremities: extremities normal, atraumatic, no cyanosis or edema  EKG sinus bradycardia 47 without ST or T-wave changes. I personally reviewed this EKG.  ASSESSMENT AND PLAN:   Atypical chest pain Ms. Fiser was seen in the emergency room on 10/01/16 for evaluation of an episode of atypical chest pain that awakened her from sleep. The pain ultimately resolved. There were no other associated findings. She ruled out for myocardial infarction. Her EKG showed no acute changes. She has no cardiac risk factors. Since that episode she still noticed persistent fatigue and episodic dyspnea. Am going to get a 2-D echocardiogram and a routine GXT to further evaluate.  Palpitations Ms. Birkhead relates episodes of tachypalpitations with episodic dizziness. I'm going to get a 30 day  event monitor to further evaluate.      Lorretta Harp MD FACP,FACC,FAHA, [facility] 11/22/2016 10:04 AM

## 2016-11-22 NOTE — Assessment & Plan Note (Addendum)
Jill Craig relates episodes of tachypalpitations with episodic dizziness. I'm going to get a 30 day event monitor to further evaluate.

## 2016-11-28 ENCOUNTER — Telehealth (HOSPITAL_COMMUNITY): Payer: Self-pay

## 2016-11-28 NOTE — Telephone Encounter (Signed)
Encounter complete. 

## 2016-11-30 ENCOUNTER — Ambulatory Visit (HOSPITAL_COMMUNITY)
Admission: RE | Admit: 2016-11-30 | Discharge: 2016-11-30 | Disposition: A | Discharge status: Home / Self Care | Payer: BLUE CROSS/BLUE SHIELD | Source: Ambulatory Visit | Attending: Cardiology | Admitting: Cardiology

## 2016-11-30 DIAGNOSIS — R0789 Other chest pain: Secondary | ICD-10-CM | POA: Insufficient documentation

## 2016-11-30 DIAGNOSIS — R002 Palpitations: Secondary | ICD-10-CM

## 2016-11-30 DIAGNOSIS — R42 Dizziness and giddiness: Secondary | ICD-10-CM | POA: Insufficient documentation

## 2016-11-30 LAB — EXERCISE TOLERANCE TEST
CHL RATE OF PERCEIVED EXERTION: 17
CSEPED: 9 min
CSEPHR: 93 %
Estimated workload: 10.1 METS
Exercise duration (sec): 0 s
MPHR: 168 {beats}/min
Peak HR: 157 {beats}/min
Rest HR: 57 {beats}/min

## 2016-12-06 ENCOUNTER — Ambulatory Visit (INDEPENDENT_AMBULATORY_CARE_PROVIDER_SITE_OTHER): Payer: BLUE CROSS/BLUE SHIELD

## 2016-12-06 ENCOUNTER — Encounter (HOSPITAL_COMMUNITY): Payer: Self-pay

## 2016-12-06 ENCOUNTER — Ambulatory Visit (HOSPITAL_COMMUNITY): Payer: BLUE CROSS/BLUE SHIELD

## 2016-12-06 DIAGNOSIS — R0789 Other chest pain: Secondary | ICD-10-CM | POA: Diagnosis not present

## 2016-12-06 DIAGNOSIS — R42 Dizziness and giddiness: Secondary | ICD-10-CM | POA: Diagnosis not present

## 2016-12-06 DIAGNOSIS — R002 Palpitations: Secondary | ICD-10-CM | POA: Diagnosis not present

## 2016-12-07 ENCOUNTER — Ambulatory Visit (HOSPITAL_COMMUNITY): Payer: BLUE CROSS/BLUE SHIELD | Attending: Cardiovascular Disease

## 2016-12-07 ENCOUNTER — Other Ambulatory Visit: Payer: Self-pay

## 2016-12-07 DIAGNOSIS — R42 Dizziness and giddiness: Secondary | ICD-10-CM

## 2016-12-07 DIAGNOSIS — R002 Palpitations: Secondary | ICD-10-CM | POA: Diagnosis not present

## 2016-12-07 DIAGNOSIS — R0789 Other chest pain: Secondary | ICD-10-CM | POA: Diagnosis not present

## 2016-12-07 LAB — ECHOCARDIOGRAM COMPLETE
AOASC: 26 cm
AVLVOTPG: 5 mmHg
EERAT: 7.87
EWDT: 280 ms
FS: 43 % (ref 28–44)
IV/PV OW: 0.85
LA diam end sys: 33 mm
LA vol A4C: 45.5 ml
LA vol: 41.1 mL
LADIAMINDEX: 1.98 cm/m2
LASIZE: 33 mm
LAVOLIN: 24.7 mL/m2
LV E/e' medial: 7.87
LV PW d: 9.19 mm — AB (ref 0.6–1.1)
LV e' LATERAL: 12 cm/s
LVEEAVG: 7.87
LVOT SV: 74 mL
LVOT VTI: 29.3 cm
LVOT area: 2.54 cm2
LVOT diameter: 18 mm
LVOTPV: 112 cm/s
Lateral S' vel: 9.87 cm/s
MV Dec: 280
MV Peak grad: 4 mmHg
MVPKAVEL: 53.3 m/s
MVPKEVEL: 94.4 m/s
RV TAPSE: 22.8 mm
RV sys press: 17 mmHg
Reg peak vel: 188 cm/s
TDI e' lateral: 12
TDI e' medial: 7.62
TR max vel: 188 cm/s

## 2017-01-11 ENCOUNTER — Ambulatory Visit: Payer: BLUE CROSS/BLUE SHIELD | Admitting: Obstetrics & Gynecology

## 2017-01-16 ENCOUNTER — Encounter: Payer: Self-pay | Admitting: Cardiovascular Disease

## 2017-01-16 ENCOUNTER — Ambulatory Visit (INDEPENDENT_AMBULATORY_CARE_PROVIDER_SITE_OTHER): Payer: BLUE CROSS/BLUE SHIELD | Admitting: Cardiovascular Disease

## 2017-01-16 DIAGNOSIS — R002 Palpitations: Secondary | ICD-10-CM | POA: Diagnosis not present

## 2017-01-16 DIAGNOSIS — R0789 Other chest pain: Secondary | ICD-10-CM | POA: Diagnosis not present

## 2017-01-16 NOTE — Assessment & Plan Note (Signed)
History of palpitations with a moderate discharge sinus rhythm/sinus bradycardia. She has had no further symptoms.

## 2017-01-16 NOTE — Progress Notes (Signed)
Jill Craig returns today for follow-up for outpatient studies performed in evaluation of atypical chest pain and palpitations. A routine GXT was normal as was a 2-D echo and event monitor. Her symptoms have since resolved. I will see her back when necessary.  Lorretta Harp, M.D., [place], [facility], [place] [address] [place], [place]  [address]  [phone] 01/16/2017 9:23 AM

## 2017-01-16 NOTE — Patient Instructions (Signed)
Medication Instructions: Your physician recommends that you continue on your current medications as directed. Please refer to the Current Medication list given to you today.   Follow-Up: Your physician recommends that you schedule a follow-up appointment as needed with Dr. Berry.    

## 2017-01-16 NOTE — Assessment & Plan Note (Signed)
History of atypical chest pain with negative GXT performed 11/30/16. She has had no recurrent symptoms.

## 2017-02-02 ENCOUNTER — Encounter: Payer: Self-pay | Admitting: Obstetrics & Gynecology

## 2017-02-02 ENCOUNTER — Ambulatory Visit (INDEPENDENT_AMBULATORY_CARE_PROVIDER_SITE_OTHER): Payer: BLUE CROSS/BLUE SHIELD | Admitting: Obstetrics & Gynecology

## 2017-02-02 VITALS — BP 108/64 | HR 66 | Resp 14 | Ht 62.0 in | Wt 135.0 lb

## 2017-02-02 DIAGNOSIS — Z23 Encounter for immunization: Secondary | ICD-10-CM

## 2017-02-02 DIAGNOSIS — Z0000 Encounter for general adult medical examination without abnormal findings: Secondary | ICD-10-CM

## 2017-02-02 DIAGNOSIS — Z01419 Encounter for gynecological examination (general) (routine) without abnormal findings: Secondary | ICD-10-CM | POA: Diagnosis not present

## 2017-02-02 NOTE — Progress Notes (Signed)
51 y.o. G0P0000 MarriedCaucasianF here for annual exam.  Doing well, now, but she did have some cardiac issues this summer.  Had stress test, echo, and holter monitor.  Had blood work done outside the [facility]. In March.  Cholesterol was 224.  LDL 123.  HDL 82.  She has this on her phone with her.  Thyroid function was normal.  FSH was 35.    Cycles are irregular.  Will cycle for two or three months and then can skip several months.  None are heavy.  [name] last menstrual period was 01/19/2017.          Sexually active: No.  The current method of family planning is abstinence.    Exercising: No.  The patient does not participate in regular exercise at present. Smoker:  yes  Health Maintenance: Pap:  01/10/16 negative, HR HPV negative, 03/03/10 normal  History of abnormal Pap:  no MMG:  03/08/15 BIRADS 1 negative- declines but after discussion, she is willing to Colonoscopy:  Never- declines BMD:   never TDaP:  04/03/06- would like to update today  Pneumonia vaccine(s):  never Zostavax:   never Hep C testing: not indicated  Screening Labs: discuss with provider, Hb today: discuss with provider   reports that she has been smoking.  She has never used smokeless tobacco. She reports that she drinks about 8.4 oz of alcohol per week . She reports that she does not use drugs.  Past Medical History:  Diagnosis Date  . HSV infection 2004   R Buttock    No past surgical history on file.  No current outpatient prescriptions on file.   No current facility-administered medications for this visit.     Family History  Problem Relation Age of Onset  . Prostate cancer Father   . Breast cancer Sister 51       Genetics negative    ROS:  Pertinent items are noted in HPI.  Otherwise, a comprehensive ROS was negative.  Exam:   BP 108/64 (BP Location: Right Arm, Patient Position: Sitting, Cuff Size: Normal)   Pulse 66   Resp 14   Ht 5\' 2"  (1.575 m)   Wt 135 lb (61.2 kg)   LMP 01/19/2017    BMI 24.69 kg/m   Weight change: +2#    Height: 5\' 2"  (157.5 cm)  Ht Readings from Last 3 Encounters:  02/02/17 5\' 2"  (1.575 m)  01/16/17 5\' 1"  (1.549 m)  11/22/16 5\' 2"  (1.575 m)    General appearance: alert, cooperative and appears stated age Head: Normocephalic, without obvious abnormality, atraumatic Neck: no adenopathy, supple, symmetrical, trachea midline and thyroid normal to inspection and palpation Lungs: clear to auscultation bilaterally Breasts: normal appearance, no masses or tenderness Heart: regular rate and rhythm Abdomen: soft, non-tender; bowel sounds normal; no masses,  no organomegaly Extremities: extremities normal, atraumatic, no cyanosis or edema Skin: Skin color, texture, turgor normal. No rashes or lesions Lymph nodes: Cervical, supraclavicular, and axillary nodes normal. No abnormal inguinal nodes palpated Neurologic: Grossly normal   Pelvic: External genitalia:  no lesions              Urethra:  normal appearing urethra with no masses, tenderness or lesions              Bartholins and Skenes: normal                 Vagina: normal appearing vagina with normal color and discharge, no lesions  Cervix: no lesions              Pap taken: No. Bimanual Exam:  Uterus:  normal size, contour, position, consistency, mobility, non-tender              Adnexa: normal adnexa and no mass, fullness, tenderness               Rectovaginal: Confirms               Anus:  normal sphincter tone, no lesions  Chaperone was present for exam.  A:  Well Woman with normal exam Perimenopausal Elevated lipids Fatigue Stressors  P:   Mammogram guidelines reviewed.  She is willing to have one this year pap smear and HR HPV neg 2017.  No pap smear obtained today Pt will return for FLP, TSH, estradiol, FSH tdap updated today return annually or prn

## 2017-02-07 ENCOUNTER — Other Ambulatory Visit (INDEPENDENT_AMBULATORY_CARE_PROVIDER_SITE_OTHER): Payer: BLUE CROSS/BLUE SHIELD

## 2017-02-07 DIAGNOSIS — Z0000 Encounter for general adult medical examination without abnormal findings: Secondary | ICD-10-CM | POA: Diagnosis not present

## 2017-02-08 LAB — LIPID PANEL
Chol/HDL Ratio: 3.2 ratio (ref 0.0–4.4)
Cholesterol, Total: 213 mg/dL — ABNORMAL HIGH (ref 100–199)
HDL: 66 mg/dL (ref >39)
LDL CALC: 133 mg/dL — AB (ref 0–99)
Triglycerides: 71 mg/dL (ref 0–149)
VLDL CHOLESTEROL CAL: 14 mg/dL (ref 5–40)

## 2017-02-08 LAB — ESTRADIOL: ESTRADIOL: 40.2 pg/mL

## 2017-02-08 LAB — TSH: TSH: 1.4 u[IU]/mL (ref 0.450–4.500)

## 2017-02-08 LAB — FOLLICLE STIMULATING HORMONE: FSH: 66.9 m[IU]/mL

## 2017-02-12 ENCOUNTER — Telehealth: Payer: Self-pay | Admitting: *Deleted

## 2017-02-12 NOTE — Telephone Encounter (Signed)
-----   Message from [name], MD sent at 02/09/2017  4:24 AM EST ----- Please let pt know her [place] is elevated and she is in menopausal range at this time.  Estradiol is low as expected.  TSH normal.  Cholesterol mildly elevated at 213 with LDL 133.  HDLs good.  Triglycerides normal.  She is considering HRT but may want compounded hormonal therapy or care with integrative medicine provider.  If desires integrative, then would recommend [facility] in [facility]

## 2017-02-12 NOTE — Telephone Encounter (Signed)
Message left to return call to Tilden Broz at [phone].    

## 2017-02-12 NOTE — Telephone Encounter (Signed)
Returned call to patient. Results reviewed with patient and she verbalized understanding. Patient states she would like to try the compounded hormonal therapy. RN advised would update Dr. Sabra Heck of decision on hormonal therapy. Patient agreeable.   Routing to provider for review.

## 2017-02-12 NOTE — Telephone Encounter (Signed)
Patient returning your call.

## 2017-03-13 DIAGNOSIS — Z1212 Encounter for screening for malignant neoplasm of rectum: Secondary | ICD-10-CM | POA: Diagnosis not present

## 2017-03-13 DIAGNOSIS — Z1211 Encounter for screening for malignant neoplasm of colon: Secondary | ICD-10-CM | POA: Diagnosis not present

## 2017-03-23 ENCOUNTER — Other Ambulatory Visit: Payer: Self-pay | Admitting: *Deleted

## 2017-03-23 ENCOUNTER — Telehealth: Payer: Self-pay | Admitting: *Deleted

## 2017-03-23 DIAGNOSIS — Z1211 Encounter for screening for malignant neoplasm of colon: Secondary | ICD-10-CM

## 2017-03-23 LAB — COLOGUARD: Cologuard: NEGATIVE

## 2017-03-23 NOTE — Progress Notes (Unsigned)
lo

## 2017-03-23 NOTE — Telephone Encounter (Signed)
Detailed message left per DPR with negative Cologuard results. Advised patient to return call if any additional questions.   Encounter closed.

## 2017-04-02 NOTE — Telephone Encounter (Signed)
Dr. Sabra Heck- please advise on compound therapy.

## 2018-04-23 ENCOUNTER — Ambulatory Visit (INDEPENDENT_AMBULATORY_CARE_PROVIDER_SITE_OTHER): Payer: BLUE CROSS/BLUE SHIELD | Admitting: Obstetrics & Gynecology

## 2018-04-23 ENCOUNTER — Encounter: Payer: Self-pay | Admitting: Obstetrics & Gynecology

## 2018-04-23 ENCOUNTER — Other Ambulatory Visit (HOSPITAL_COMMUNITY)
Admission: RE | Admit: 2018-04-23 | Discharge: 2018-04-23 | Disposition: A | Discharge status: Home / Self Care | Payer: BLUE CROSS/BLUE SHIELD | Source: Ambulatory Visit | Attending: Obstetrics & Gynecology | Admitting: Obstetrics & Gynecology

## 2018-04-23 VITALS — BP 100/60 | HR 72 | Resp 16 | Ht 61.5 in | Wt 125.0 lb

## 2018-04-23 DIAGNOSIS — Z124 Encounter for screening for malignant neoplasm of cervix: Secondary | ICD-10-CM

## 2018-04-23 DIAGNOSIS — Z01419 Encounter for gynecological examination (general) (routine) without abnormal findings: Secondary | ICD-10-CM

## 2018-04-23 DIAGNOSIS — N898 Other specified noninflammatory disorders of vagina: Secondary | ICD-10-CM

## 2018-04-23 NOTE — Progress Notes (Signed)
53 y.o. G0P0000 Married Unavailable female here for annual exam.  Doing well.  Denies vaginal bleeding.  Has done intermittent fasting.  Lost about 15 pounds.  Very pleased with this.  She did this for about four months.  Had some hot flashes for about six months.  This has resolved.    [name] last menstrual period was 06/01/2017 (approximate).          Sexually active: Yes.    The current method of family planning is none.    Exercising: No.   Smoker:  yes  Health Maintenance:  Pap:  01/10/16 Neg. HR HPV:neg   History of abnormal Pap:  no MMG:  03/08/15 BIRADS1:Neg  Colonoscopy: Cologuard 03/13/17 Neg  BMD:   Never TDaP:  2018   Pneumonia vaccine(s):  n/a Shingrix:   No Hep C testing: n/a Screening Labs: Not today    reports that she has been smoking. She has never used smokeless tobacco. She reports current alcohol use of about 2.0 standard drinks of alcohol per week. She reports that she does not use drugs.  Past Medical History:  Diagnosis Date  . HSV infection 2004   R Buttock    History reviewed. No pertinent surgical history.  Current Outpatient Medications  Medication Sig Dispense Refill  . Multiple Vitamin (MULTIVITAMIN) tablet Take 1 tablet by mouth daily.     No current facility-administered medications for this visit.     Family History  Problem Relation Age of Onset  . Prostate cancer Father   . Breast cancer Sister 27       Genetics negative    Review of Systems  Genitourinary: Positive for vaginal discharge.  All other systems reviewed and are negative.   Exam:   BP 100/60 (BP Location: Right Arm, Patient Position: Sitting, Cuff Size: Normal)   Pulse 72   Resp 16   Ht 5' 1.5" (1.562 m)   Wt 125 lb (56.7 kg)   LMP 06/01/2017 (Approximate)   BMI 23.24 kg/m      Height: 5' 1.5" (156.2 cm)  Ht Readings from Last 3 Encounters:  04/23/18 5' 1.5" (1.562 m)  02/02/17 5\' 2"  (1.575 m)  01/16/17 5\' 1"  (1.549 m)    General appearance: alert,  cooperative and appears stated age Head: Normocephalic, without obvious abnormality, atraumatic Neck: no adenopathy, supple, symmetrical, trachea midline and thyroid normal to inspection and palpation Lungs: clear to auscultation bilaterally Breasts: normal appearance, no masses or tenderness on left, right with 3.5cm mobile mass c/w breast cyst, non tender, no LAD Heart: regular rate and rhythm Abdomen: soft, non-tender; bowel sounds normal; no masses,  no organomegaly Extremities: extremities normal, atraumatic, no cyanosis or edema Skin: Skin color, texture, turgor normal. No rashes or lesions Lymph nodes: Cervical, supraclavicular, and axillary nodes normal. No abnormal inguinal nodes palpated Neurologic: Grossly normal   Pelvic: External genitalia:  no lesions              Urethra:  normal appearing urethra with no masses, tenderness or lesions              Bartholins and Skenes: normal                 Vagina: normal appearing vagina with normal color and discharge, no lesions              Cervix: no lesions              Pap taken: Yes.   Bimanual Exam:  Uterus:  normal size, contour, position, consistency, mobility, non-tender              Adnexa: normal adnexa and no mass, fullness, tenderness               Rectovaginal: Confirms               Anus:  normal sphincter tone, no lesions  Chaperone was present for exam.  A:  Well Woman with normal exam PMP, no HRT Purposeful weight loss Family hx of breast cancer, age 51 with negative genetic testing (has been almost 10 years since diagnosed) Breast cyst on left  P:   Mammogram guidelines reviewed.  Aware overdue.  She reports breast cyst wasn't present 3 days ago so will wait until this resolves and then schedule.  Number provided. pap smear obtained today.  Neg HR HPV 10/17 Not interested in any blood work today Declines shingrix vaccinations Cologuard done 12/18.  Due again 03/2020. Return annually or prn

## 2018-04-23 NOTE — Addendum Note (Signed)
Addended by: Polly Cobia on: 04/23/2018 02:31 PM   Modules accepted: Orders

## 2018-04-24 LAB — VAGINITIS/VAGINOSIS, DNA PROBE
CANDIDA SPECIES: NEGATIVE
GARDNERELLA VAGINALIS: NEGATIVE
Trichomonas vaginosis: NEGATIVE

## 2018-04-29 LAB — CYTOLOGY - PAP: DIAGNOSIS: NEGATIVE

## 2018-06-03 ENCOUNTER — Telehealth: Payer: Self-pay | Admitting: Obstetrics & Gynecology

## 2018-06-03 NOTE — Telephone Encounter (Signed)
Order faxed to [facility].   Encounter closed.  

## 2018-06-03 NOTE — Telephone Encounter (Signed)
Spoke with patient, requesting order to [facility] for diagnostic MMG. Seen in office on 04/23/18, cyst on left breast noted. Cyst has gotten larger and harder. Denies fever/cholls, redness, swelling or nipple d/c. Overdue for screening MMG.   Advised I will review with Dr. Sabra Heck, will fax order to [facility] with instructions for them ton call patient directly to schedule. Will return call if any additional recommendations from Dr. Sabra Heck. Patient agreeable.   Order to Dr. Sabra Heck to review and sign.

## 2018-06-03 NOTE — Telephone Encounter (Signed)
Patient is calling requesting order for mammogram.

## 2018-06-06 ENCOUNTER — Telehealth: Payer: Self-pay | Admitting: Obstetrics & Gynecology

## 2018-06-06 ENCOUNTER — Encounter: Payer: Self-pay | Admitting: Obstetrics & Gynecology

## 2018-06-06 DIAGNOSIS — Z803 Family history of malignant neoplasm of breast: Secondary | ICD-10-CM | POA: Diagnosis not present

## 2018-06-06 DIAGNOSIS — N6002 Solitary cyst of left breast: Secondary | ICD-10-CM | POA: Diagnosis not present

## 2018-06-06 DIAGNOSIS — N6320 Unspecified lump in the left breast, unspecified quadrant: Secondary | ICD-10-CM | POA: Diagnosis not present

## 2018-06-06 NOTE — Telephone Encounter (Signed)
[facility] is calling to get clearance to aspirate a cyst on patient. Call transferred to [facility], [place].

## 2018-06-06 NOTE — Telephone Encounter (Signed)
Call received. Spoke to [place] at [place].  Last annual 04-23-2018 with Dr Sabra Heck. Takes MVI daily, No medical conditions.  Per Dr Sabra Heck, ok to proceed with cyst aspiration. Order given to [place].   Routing to Dr Sabra Heck. Encounter closed.

## 2018-09-06 ENCOUNTER — Encounter: Payer: Self-pay | Admitting: Obstetrics & Gynecology

## 2018-09-06 ENCOUNTER — Ambulatory Visit: Payer: BC Managed Care – PPO | Admitting: Obstetrics & Gynecology

## 2018-09-06 ENCOUNTER — Other Ambulatory Visit: Payer: Self-pay

## 2018-09-06 VITALS — BP 96/60 | HR 76 | Temp 98.0°F | Ht 61.5 in | Wt 131.0 lb

## 2018-09-06 DIAGNOSIS — R300 Dysuria: Secondary | ICD-10-CM

## 2018-09-06 MED ORDER — FLUCONAZOLE 150 MG PO TABS: ORAL_TABLET | ORAL | 0 refills | Status: DC

## 2018-09-06 MED ORDER — SULFAMETHOXAZOLE-TRIMETHOPRIM 800-160 MG PO TABS: 1.0000 | ORAL_TABLET | Freq: Two times a day (BID) | ORAL | 0 refills | Status: DC

## 2018-09-06 NOTE — Progress Notes (Signed)
GYNECOLOGY  VISIT  CC:   UTI symptoms   HPI: 53 y.o. G53P0000 Married female here for dysuria, urinary frequency x 10 days.  She has been taking cranberry juice.  Then she tried AZO.  Both helped but haven't resolved symptoms.  She denies back or pelvic pain.  Denies vaginal bleeding or discharge.  Denies fever.  Denies urinary odor.    GYNECOLOGIC HISTORY: [name] last menstrual period was 06/01/2017 (approximate). Contraception: PMP Menopausal hormone therapy: none  Patient Active Problem List   Diagnosis Date Noted  . Atypical chest pain 11/22/2016  . Dizziness 11/22/2016  . Palpitations 11/22/2016  . Fibrocystic breast disease 06/06/2013  . Low back pain radiating to both legs 11/02/2010    Past Medical History:  Diagnosis Date  . HSV infection 2004   R Buttock    History reviewed. No pertinent surgical history.  MEDS:   Current Outpatient Medications on File Prior to Visit  Medication Sig Dispense Refill  . Homeopathic Products (AZO CONFIDENCE PO) Take by mouth daily as needed.    . Multiple Vitamin (MULTIVITAMIN) tablet Take 1 tablet by mouth daily.     No current facility-administered medications on file prior to visit.     ALLERGIES: Patient has no known allergies.  Family History  Problem Relation Age of Onset  . Prostate cancer Father   . Breast cancer Sister 30       Genetics negative    SH:  Married, non smoker  Review of Systems  Genitourinary: Positive for dysuria and frequency.  All other systems reviewed and are negative.   PHYSICAL EXAMINATION:    BP 96/60   Pulse 76   Temp 98 F (36.7 C) (Temporal)   Ht 5' 1.5" (1.562 m)   Wt 131 lb (59.4 kg)   LMP 06/01/2017 (Approximate)   BMI 24.35 kg/m     General appearance: alert, cooperative and appears stated age Flank:  No CVA Abdomen: soft, non-tender; bowel sounds normal; no masses,  no organomegaly Lymph:  no inguinal LAD noted  Pelvic: External genitalia:  no lesions  Urethra:  normal appearing urethra with no masses, tenderness or lesions              Bartholins and Skenes: normal                 Vagina: normal appearing vagina with normal color and discharge, no lesions              Cervix: no lesions              Bimanual Exam:  Uterus:  normal size, contour, position, consistency, mobility, non-tender              Adnexa: no mass, fullness, tenderness  Chaperone was present for exam.  Assessment: Urinary dysuria, frequency, urgency  Plan: Urine culture pending Bactrim DS BID x 3 days Diflucan 150mg  po x 1, repeat 72 hours if needed.  #2/0RF

## 2018-09-07 ENCOUNTER — Emergency Department (HOSPITAL_COMMUNITY): Payer: BC Managed Care – PPO

## 2018-09-07 ENCOUNTER — Emergency Department (HOSPITAL_COMMUNITY)
Admission: EM | Admit: 2018-09-07 | Discharge: 2018-09-07 | Disposition: A | Discharge status: Home / Self Care | Payer: BC Managed Care – PPO | Attending: Emergency Medicine | Admitting: Emergency Medicine

## 2018-09-07 ENCOUNTER — Encounter (HOSPITAL_COMMUNITY): Payer: Self-pay | Admitting: *Deleted

## 2018-09-07 ENCOUNTER — Other Ambulatory Visit: Payer: Self-pay

## 2018-09-07 DIAGNOSIS — Z79899 Other long term (current) drug therapy: Secondary | ICD-10-CM | POA: Diagnosis not present

## 2018-09-07 DIAGNOSIS — N200 Calculus of kidney: Secondary | ICD-10-CM | POA: Diagnosis not present

## 2018-09-07 DIAGNOSIS — R1031 Right lower quadrant pain: Secondary | ICD-10-CM | POA: Diagnosis not present

## 2018-09-07 DIAGNOSIS — F17200 Nicotine dependence, unspecified, uncomplicated: Secondary | ICD-10-CM | POA: Insufficient documentation

## 2018-09-07 DIAGNOSIS — N132 Hydronephrosis with renal and ureteral calculous obstruction: Secondary | ICD-10-CM | POA: Diagnosis not present

## 2018-09-07 LAB — URINALYSIS, ROUTINE W REFLEX MICROSCOPIC
Bilirubin Urine: NEGATIVE
Glucose, UA: NEGATIVE mg/dL
Ketones, ur: NEGATIVE mg/dL
Leukocytes,Ua: NEGATIVE
Nitrite: POSITIVE — AB
Protein, ur: NEGATIVE mg/dL
Specific Gravity, Urine: 1.025 (ref 1.005–1.030)
pH: 5 (ref 5.0–8.0)

## 2018-09-07 LAB — I-STAT BETA HCG BLOOD, ED (MC, WL, AP ONLY): I-stat hCG, quantitative: <5 m[IU]/mL (ref <5)

## 2018-09-07 LAB — COMPREHENSIVE METABOLIC PANEL
ALT: 13 U/L (ref 0–44)
AST: 16 U/L (ref 15–41)
Albumin: 4 g/dL (ref 3.5–5.0)
Alkaline Phosphatase: 54 U/L (ref 38–126)
Anion gap: 5 (ref 5–15)
BUN: 21 mg/dL — ABNORMAL HIGH (ref 6–20)
CO2: 26 mmol/L (ref 22–32)
Calcium: 8.8 mg/dL — ABNORMAL LOW (ref 8.9–10.3)
Chloride: 107 mmol/L (ref 98–111)
Creatinine, Ser: 0.76 mg/dL (ref 0.44–1.00)
GFR calc Af Amer: >60 mL/min (ref >60)
GFR calc non Af Amer: >60 mL/min (ref >60)
Glucose, Bld: 118 mg/dL — ABNORMAL HIGH (ref 70–99)
Potassium: 3.6 mmol/L (ref 3.5–5.1)
Sodium: 138 mmol/L (ref 135–145)
Total Bilirubin: 0.4 mg/dL (ref 0.3–1.2)
Total Protein: 7.1 g/dL (ref 6.5–8.1)

## 2018-09-07 LAB — CBC WITH DIFFERENTIAL/PLATELET
Abs Immature Granulocytes: 0.02 10*3/uL (ref 0.00–0.07)
Basophils Absolute: 0 10*3/uL (ref 0.0–0.1)
Basophils Relative: 0 %
Eosinophils Absolute: 0.2 10*3/uL (ref 0.0–0.5)
Eosinophils Relative: 2 %
HCT: 35.4 % — ABNORMAL LOW (ref 36.0–46.0)
Hemoglobin: 11.1 g/dL — ABNORMAL LOW (ref 12.0–15.0)
Immature Granulocytes: 0 %
Lymphocytes Relative: 37 %
Lymphs Abs: 3 10*3/uL (ref 0.7–4.0)
MCH: 29.8 pg (ref 26.0–34.0)
MCHC: 31.4 g/dL (ref 30.0–36.0)
MCV: 94.9 fL (ref 80.0–100.0)
Monocytes Absolute: 0.6 10*3/uL (ref 0.1–1.0)
Monocytes Relative: 8 %
Neutro Abs: 4.3 10*3/uL (ref 1.7–7.7)
Neutrophils Relative %: 53 %
Platelets: 190 10*3/uL (ref 150–400)
RBC: 3.73 MIL/uL — ABNORMAL LOW (ref 3.87–5.11)
RDW: 13 % (ref 11.5–15.5)
WBC: 8.2 10*3/uL (ref 4.0–10.5)
nRBC: 0 % (ref 0.0–0.2)

## 2018-09-07 LAB — LIPASE, BLOOD: Lipase: 33 U/L (ref 11–51)

## 2018-09-07 LAB — URINALYSIS, MICROSCOPIC ONLY: Casts: NONE SEEN /lpf

## 2018-09-07 LAB — URINE CULTURE

## 2018-09-07 MED ORDER — SODIUM CHLORIDE 0.9 % IV SOLN
1.0000 g | Freq: Once | INTRAVENOUS | Status: AC
  Administered 2018-09-07: 1 g via INTRAVENOUS
  Filled 2018-09-07: qty 10

## 2018-09-07 MED ORDER — TAMSULOSIN HCL 0.4 MG PO CAPS: 0.4000 mg | ORAL_CAPSULE | Freq: Every day | ORAL | 0 refills | Status: DC

## 2018-09-07 MED ORDER — ONDANSETRON 4 MG PO TBDP: 4.0000 mg | ORAL_TABLET | Freq: Once | ORAL | Status: DC

## 2018-09-07 MED ORDER — ONDANSETRON HCL 4 MG/2ML IJ SOLN
4.0000 mg | Freq: Once | INTRAMUSCULAR | Status: AC
  Administered 2018-09-07: 4 mg via INTRAVENOUS
  Filled 2018-09-07: qty 2

## 2018-09-07 MED ORDER — ONDANSETRON 4 MG PO TBDP: 4.0000 mg | ORAL_TABLET | Freq: Four times a day (QID) | ORAL | 0 refills | Status: DC | PRN

## 2018-09-07 MED ORDER — IOHEXOL 300 MG/ML  SOLN
100.0000 mL | Freq: Once | INTRAMUSCULAR | Status: AC | PRN
  Administered 2018-09-07: 100 mL via INTRAVENOUS

## 2018-09-07 MED ORDER — OXYCODONE-ACETAMINOPHEN 5-325 MG PO TABS: 2.0000 | ORAL_TABLET | Freq: Four times a day (QID) | ORAL | 0 refills | Status: DC | PRN

## 2018-09-07 MED ORDER — SODIUM CHLORIDE 0.9% FLUSH: 3.0000 mL | Freq: Once | INTRAVENOUS | Status: DC

## 2018-09-07 MED ORDER — SODIUM CHLORIDE (PF) 0.9 % IJ SOLN
INTRAMUSCULAR | Status: AC
  Administered 2018-09-07: 05:00:00 via INTRAVENOUS
  Filled 2018-09-07: qty 50

## 2018-09-07 MED ORDER — KETOROLAC TROMETHAMINE 30 MG/ML IJ SOLN
30.0000 mg | Freq: Once | INTRAMUSCULAR | Status: AC
  Administered 2018-09-07: 30 mg via INTRAVENOUS
  Filled 2018-09-07: qty 1

## 2018-09-07 MED ORDER — CEPHALEXIN 500 MG PO CAPS: 500.0000 mg | ORAL_CAPSULE | Freq: Three times a day (TID) | ORAL | 0 refills | Status: DC

## 2018-09-07 MED ORDER — IBUPROFEN 800 MG PO TABS: 800.0000 mg | ORAL_TABLET | Freq: Three times a day (TID) | ORAL | 0 refills | Status: DC | PRN

## 2018-09-07 MED ORDER — HYDROMORPHONE HCL 1 MG/ML IJ SOLN
1.0000 mg | Freq: Once | INTRAMUSCULAR | Status: AC
  Administered 2018-09-07: 1 mg via INTRAVENOUS
  Filled 2018-09-07: qty 1

## 2018-09-07 MED ORDER — SODIUM CHLORIDE 0.9 % IV BOLUS (SEPSIS)
1000.0000 mL | Freq: Once | INTRAVENOUS | Status: AC
  Administered 2018-09-07: 1000 mL via INTRAVENOUS

## 2018-09-07 NOTE — ED Notes (Signed)
Pt unable to provide a urine specimen at this time

## 2018-09-07 NOTE — ED Provider Notes (Signed)
TIME SEEN: 3:20 AM  CHIEF COMPLAINT: right sided abdominal pain, vomiting  HPI: Patient is a 53 year old female with no significant past medical history who presents to the emergency department with sudden onset right-sided lower abdominal pain, vomiting started about an hour ago.  States she did have some dysuria and hematuria and her doctor diagnosed her with a UTI and started her on antibiotics which she did not start taking today.  She denies any fevers, chills, cough, chest pain, shortness of breath, diarrhea, vaginal bleeding, discharge.  No history of abdominal surgeries.  No history of kidney stones.  Has never had symptoms like this before.  ROS: See HPI Constitutional: no fever  Eyes: no drainage  ENT: no runny nose   Cardiovascular:  no chest pain  Resp: no SOB  GI: no vomiting GU: no dysuria Integumentary: no rash  Allergy: no hives  Musculoskeletal: no leg swelling  Neurological: no slurred speech ROS otherwise negative  PAST MEDICAL HISTORY/PAST SURGICAL HISTORY:  Past Medical History:  Diagnosis Date  . HSV infection 2004   R Buttock    MEDICATIONS:  Prior to Admission medications   Medication Sig Start Date End Date Taking? Authorizing Provider  fluconazole (DIFLUCAN) 150 MG tablet 1 tab po x 1, repeat in 72 hours. 09/06/18   [name], MD  Homeopathic Products (AZO CONFIDENCE PO) Take by mouth daily as needed.    [provider]  Multiple Vitamin (MULTIVITAMIN) tablet Take 1 tablet by mouth daily.    [provider]  sulfamethoxazole-trimethoprim (BACTRIM DS) 800-160 MG tablet Take 1 tablet by mouth 2 (two) times daily. 09/06/18   [name], MD    ALLERGIES:  No Known Allergies  SOCIAL HISTORY:  Social History   Tobacco Use  . Smoking status: Light Tobacco Smoker  . Smokeless tobacco: Never Used  Substance Use Topics  . Alcohol use: Yes    Alcohol/week: 2.0 standard drinks    Types: 2 Glasses of wine per week    FAMILY  HISTORY: Family History  Problem Relation Age of Onset  . Prostate cancer Father   . Breast cancer Sister 12       Genetics negative    EXAM: BP (!) 143/72 (BP Location: Right Arm)   Pulse 81   Temp 98.5 F (36.9 C) (Oral)   Resp 16   Ht 5\' 1"  (1.549 m)   Wt 59 kg   LMP 06/01/2017 (Approximate)   SpO2 99%   BMI 24.56 kg/m  CONSTITUTIONAL: Alert and oriented and responds appropriately to questions.  Peers very uncomfortable HEAD: Normocephalic EYES: Conjunctivae clear, pupils appear equal, EOMI ENT: normal nose; moist mucous membranes NECK: Supple, no meningismus, no nuchal rigidity, no LAD  CARD: RRR; S1 and S2 appreciated; no murmurs, no clicks, no rubs, no gallops RESP: Normal chest excursion without splinting or tachypnea; breath sounds clear and equal bilaterally; no wheezes, no rhonchi, no rales, no hypoxia or respiratory distress, speaking full sentences ABD/GI: Normal bowel sounds; non-distended; soft, mildly tender in the right upper quadrant with a negative Murphy sign, tender in the right lower quadrant at McBurney's point with voluntary guarding, no rebound, no pelvic tenderness on exam BACK:  The back appears normal and is non-tender to palpation, there is no CVA tenderness EXT: Normal ROM in all joints; non-tender to palpation; no edema; normal capillary refill; no cyanosis, no calf tenderness or swelling    SKIN: Normal color for age and race; warm; no rash NEURO: Moves all  extremities equally PSYCH: The patient's mood and manner are appropriate. Grooming and personal hygiene are appropriate.  MEDICAL DECISION MAKING: Patient here with sudden onset right lower quadrant abdominal pain.  Pain seems to be more at McBurney's point than in the pelvic area and she has no history of vaginal discharge or bleeding.  Did states she had some dysuria and hematuria today and was diagnosed with a UTI by Dr. Sabra Heck but did not start antibiotics yet.  I am concerned for possible  kidney stone, appendicitis, colitis, bowel obstruction, pyelonephritis.  Less likely TOA, torsion.  Will obtain labs, urine, CT of the abdomen pelvis.  Will give IV fluids, pain and nausea medicine.  ED PROGRESS: 4:50 AM  Pt appears much more comfortable.  She declines further pain medication.  Her labs are unremarkable.  Pregnancy test is negative.  No leukocytosis.  Normal creatinine.  Her urine is nitrite positive and has 20-50 red blood cells and many bacteria but also many squamous cells.  There are crystals present.  I am not convinced that she has a significant UTI at this time but given she does complain of some dysuria and has nitrites present, will give IV ceftriaxone.  A urine culture has been sent.  5:38 AM  CT scan shows an obstructing 3 mm stone at the distal right ureter at the level of the UVJ with a right mild hydroureteronephrosis.  No sign of stranding around the kidney.  There is also a nonobstructing 2 mm stone in the inferior pole of the left kidney.  Her pain is well controlled with 1 dose of Dilaudid here.  Again I am not convinced that she has a significant UTI given no fever, leukocytosis, white blood cells in her urine.  This may be a contaminated sample but will have her take Keflex and follow-up closely with urology.  Will discharge with ibuprofen, Percocet, Zofran, Flomax.  Discussed return precautions.   At this time, I do not feel there is any life-threatening condition present. I have reviewed and discussed all results (EKG, imaging, lab, urine as appropriate) and exam findings with patient/family. I have reviewed nursing notes and appropriate previous records.  I feel the patient is safe to be discharged home without further emergent workup and can continue workup as an outpatient as needed. Discussed usual and customary return precautions. Patient/family verbalize understanding and are comfortable with this plan.  Outpatient follow-up has been provided as needed. All  questions have been answered.    Ward, Delice Bison, DO 09/07/18 [phone]

## 2018-09-07 NOTE — ED Triage Notes (Signed)
Pt presents with abd pain,nausea, and vomiting that began about an hour prior to arrival.  Pt also states she went to the MD for a UTI but doesn't feel like this is related to it.  Pt actively throwing up in triage.

## 2018-09-08 LAB — URINE CULTURE

## 2018-09-09 ENCOUNTER — Other Ambulatory Visit: Payer: Self-pay | Admitting: *Deleted

## 2018-09-09 DIAGNOSIS — N949 Unspecified condition associated with female genital organs and menstrual cycle: Secondary | ICD-10-CM

## 2018-09-10 DIAGNOSIS — N201 Calculus of ureter: Secondary | ICD-10-CM | POA: Diagnosis not present

## 2018-09-10 DIAGNOSIS — N132 Hydronephrosis with renal and ureteral calculous obstruction: Secondary | ICD-10-CM | POA: Diagnosis not present

## 2018-10-11 DIAGNOSIS — L578 Other skin changes due to chronic exposure to nonionizing radiation: Secondary | ICD-10-CM | POA: Diagnosis not present

## 2018-10-11 DIAGNOSIS — L821 Other seborrheic keratosis: Secondary | ICD-10-CM | POA: Diagnosis not present

## 2018-10-11 DIAGNOSIS — L814 Other melanin hyperpigmentation: Secondary | ICD-10-CM | POA: Diagnosis not present

## 2018-10-11 DIAGNOSIS — D1801 Hemangioma of skin and subcutaneous tissue: Secondary | ICD-10-CM | POA: Diagnosis not present

## 2018-10-11 DIAGNOSIS — D485 Neoplasm of uncertain behavior of skin: Secondary | ICD-10-CM | POA: Diagnosis not present

## 2018-10-11 DIAGNOSIS — D2272 Melanocytic nevi of left lower limb, including hip: Secondary | ICD-10-CM | POA: Diagnosis not present

## 2018-11-28 ENCOUNTER — Telehealth: Payer: Self-pay | Admitting: Obstetrics & Gynecology

## 2018-11-28 NOTE — Telephone Encounter (Signed)
Spoke with patient to review benefit for ultrasound appointment scheduled for 12/12/2018 with Dr Sabra Heck. Patient requested to reschedule to earlier date. Patient is rescheduled on 12/05/2018 with Dr Sabra Heck.  Patient is aware of the appointment date, arrival time and cancellation policy. No further questions.  Forwarding to Dr Sabra Heck for final review. Patient is agreeable to disposition. Will close encounter

## 2018-12-03 ENCOUNTER — Other Ambulatory Visit: Payer: Self-pay

## 2018-12-05 ENCOUNTER — Ambulatory Visit (INDEPENDENT_AMBULATORY_CARE_PROVIDER_SITE_OTHER): Payer: BC Managed Care – PPO

## 2018-12-05 ENCOUNTER — Other Ambulatory Visit: Payer: Self-pay

## 2018-12-05 ENCOUNTER — Encounter: Payer: Self-pay | Admitting: Obstetrics & Gynecology

## 2018-12-05 ENCOUNTER — Ambulatory Visit: Payer: BC Managed Care – PPO | Admitting: Obstetrics & Gynecology

## 2018-12-05 VITALS — BP 92/64 | HR 60 | Temp 98.0°F | Ht 61.0 in | Wt 132.8 lb

## 2018-12-05 DIAGNOSIS — N83201 Unspecified ovarian cyst, right side: Secondary | ICD-10-CM

## 2018-12-05 DIAGNOSIS — D252 Subserosal leiomyoma of uterus: Secondary | ICD-10-CM

## 2018-12-05 DIAGNOSIS — N949 Unspecified condition associated with female genital organs and menstrual cycle: Secondary | ICD-10-CM

## 2018-12-05 MED ORDER — CICLOPIROX 8 % EX SOLN: Freq: Every day | CUTANEOUS | 1 refills | Status: DC

## 2018-12-05 NOTE — Progress Notes (Signed)
53 y.o. G40P0000 Married female here for pelvic ultrasound due to h/o 4.6 x 3.7cm ovarian cyst that was noted on CT scan 09/07/2018 when in ER due to nephrolithiasis.  She had an obstructing stone diagnosed 09/07/2018.  She also had a small fibroid noted on CT scan at the same time.  [name] last menstrual period was 06/01/2017 (approximate).  Contraception: PMP  Findings:  UTERUS: 5.6 x 3.2 x 2.3cm with 1.1cm subserosal fibroid EMS: 1.36mm ADNEXA: Left ovary: 2.1 x 2.1 x 2.4cm       Right ovary: 1.6 x 1.5 x 0.9cm with 3.2 x 3.1 x 3.2cm simple ovarian cyst, avascular with scant debris inside CUL DE SAC: no free fluid  Discussion:  Findings reviewed with pt.  Cyst is smaller, simple, avascular and without any solid component.  As she is PMP, follow-up recommended.  Do not feel ca-125 will add any additional benefit today.  Will plan to recheck in one year.  She does have a separate question about toenail fungus on her large right big toe.  Has used OTC treatment options without any improvement.  Is only on one toe.  Would like suggestions.  Topical solution and oral therapy discussed.  Monitoring of liver enzymes discussed.  She would like the topical solution to start.  Rx for penlac to pharmacy.  Instructions reviewed.  Assessment:  Simple 3.2cm right ovarian cyst in PMP female 1.1cm subserosal fibroid  Plan:  Repeat PUS 1 year.  Pt knows to call with any new onset pain on this side. Will try penlac solution for toenail fungus.  Rx to pharmacy.  ~15 minutes spent with patient >50% of time was in face to face discussion of above.

## 2018-12-12 ENCOUNTER — Other Ambulatory Visit: Payer: Self-pay | Admitting: Obstetrics & Gynecology

## 2018-12-12 ENCOUNTER — Other Ambulatory Visit: Payer: Self-pay

## 2019-02-21 ENCOUNTER — Ambulatory Visit: Payer: BC Managed Care – PPO | Admitting: Obstetrics and Gynecology

## 2019-02-21 ENCOUNTER — Encounter: Payer: Self-pay | Admitting: Obstetrics and Gynecology

## 2019-02-21 ENCOUNTER — Other Ambulatory Visit: Payer: Self-pay

## 2019-02-21 VITALS — BP 102/66 | HR 76 | Temp 97.3°F | Resp 12 | Wt 133.8 lb

## 2019-02-21 DIAGNOSIS — R3915 Urgency of urination: Secondary | ICD-10-CM

## 2019-02-21 MED ORDER — SULFAMETHOXAZOLE-TRIMETHOPRIM 800-160 MG PO TABS: 1.0000 | ORAL_TABLET | Freq: Two times a day (BID) | ORAL | 0 refills | Status: DC

## 2019-02-21 NOTE — Progress Notes (Signed)
urinGYNECOLOGY  VISIT   HPI: 53 y.o.   Married  Hispanic  female   G0P0000 with [name] last menstrual period was 06/01/2017 (approximate).   here for UTI; patient complains of having burning, urgency, frequency, and bloating since yesterday am.   No fever, back pain, nausea or vomiting.   She has a history of a renal stone and GBS UTI earlier this year.   Unable to dip urine, patient taking AZO  GYNECOLOGIC HISTORY: [name] last menstrual period was 06/01/2017 (approximate). Contraception:  Postmenoapusal Menopausal hormone therapy:  none Last mammogram:  03/08/15 BIRADS1:Neg Last pap smear:   04/23/18 Neg        OB History    Gravida  0   Para  0   Term  0   Preterm  0   AB  0   Living  0     SAB  0   TAB  0   Ectopic  0   Multiple  0   Live Births                 Patient Active Problem List   Diagnosis Date Noted  . Atypical chest pain 11/22/2016  . Dizziness 11/22/2016  . Palpitations 11/22/2016  . Fibrocystic breast disease 06/06/2013  . Low back pain radiating to both legs 11/02/2010    Past Medical History:  Diagnosis Date  . HSV infection 2004   R Buttock    History reviewed. No pertinent surgical history.  Current Outpatient Medications  Medication Sig Dispense Refill  . ciclopirox (PENLAC) 8 % solution Apply topically at bedtime. Apply over nail and surrounding skin. Apply daily over previous coat. After 7 days, may remove with alcohol and continue cycle. 6.6 mL 1  . Homeopathic Products (AZO CONFIDENCE PO) Take by mouth daily as needed.    . Multiple Vitamin (MULTIVITAMIN) tablet Take 1 tablet by mouth daily.    Marland Kitchen ibuprofen (ADVIL) 800 MG tablet Take 1 tablet (800 mg total) by mouth every 8 (eight) hours as needed for mild pain. (Patient not taking: Reported on 02/21/2019) 30 tablet 0  . ondansetron (ZOFRAN ODT) 4 MG disintegrating tablet Take 1 tablet (4 mg total) by mouth every 6 (six) hours as needed for nausea or vomiting.  (Patient not taking: Reported on 02/21/2019) 20 tablet 0  . oxyCODONE-acetaminophen (PERCOCET/ROXICET) 5-325 MG tablet Take 2 tablets by mouth every 6 (six) hours as needed. (Patient not taking: Reported on 02/21/2019) 20 tablet 0   No current facility-administered medications for this visit.      ALLERGIES: Patient has no known allergies.  Family History  Problem Relation Age of Onset  . Prostate cancer Father   . Breast cancer Sister 40       Genetics negative    Social History   Socioeconomic History  . Marital status: Married    Spouse name: Not on file  . Number of children: Not on file  . Years of education: Not on file  . Highest education level: Not on file  Occupational History  . Not on file  Social Needs  . Financial resource strain: Not on file  . Food insecurity    Worry: Not on file    Inability: Not on file  . Transportation needs    Medical: Not on file    Non-medical: Not on file  Tobacco Use  . Smoking status: Light Tobacco Smoker  . Smokeless tobacco: Never Used  Substance and Sexual Activity  . Alcohol use:  Yes    Alcohol/week: 2.0 standard drinks    Types: 2 Glasses of wine per week  . Drug use: No    Comment: husband is infertile  . Sexual activity: Yes    Birth control/protection: Post-menopausal  Lifestyle  . Physical activity    Days per week: Not on file    Minutes per session: Not on file  . Stress: Not on file  Relationships  . Social Herbalist on phone: Not on file    Gets together: Not on file    Attends religious service: Not on file    Active member of club or organization: Not on file    Attends meetings of clubs or organizations: Not on file    Relationship status: Not on file  . Intimate partner violence    Fear of current or ex partner: Not on file    Emotionally abused: Not on file    Physically abused: Not on file    Forced sexual activity: Not on file  Other Topics Concern  . Not on file  Social History  Narrative   Caffienated drinks-yes   Seat belt use often-yes   Regular Exercise-yes   Smoke alarm in the home-no   Firearms/guns in the home-yes   History of physical abuse-no                Review of Systems  Constitutional: Negative.   HENT: Negative.   Eyes: Negative.   Respiratory: Negative.   Cardiovascular: Negative.   Gastrointestinal: Negative.   Endocrine: Negative.   Genitourinary: Positive for frequency and urgency.       Burning with urination  Musculoskeletal: Negative.   Skin: Negative.   Allergic/Immunologic: Negative.   Neurological: Negative.   Hematological: Negative.   Psychiatric/Behavioral: Negative.     PHYSICAL EXAMINATION:    BP 102/66 (BP Location: Right Arm, Patient Position: Sitting, Cuff Size: Normal)   Pulse 76   Temp (!) 97.3 F (36.3 C) (Temporal)   Resp 12   Wt 133 lb 12.8 oz (60.7 kg)   LMP 06/01/2017 (Approximate)   BMI 25.28 kg/m     General appearance: alert, cooperative and appears stated age   Pelvic: External genitalia:  no lesions              Urethra:  normal appearing urethra with no masses, tenderness or lesions              Bartholins and Skenes: normal                 Vagina: normal appearing vagina with normal color and discharge, no lesions              Cervix: no lesions                Bimanual Exam:  Uterus:  normal size, contour, position, consistency, mobility, non-tender              Adnexa: no mass, fullness, tenderness       Chaperone was present for exam.  ASSESSMENT   Cystitis. Hx UTI.  Hx renal stones.   PLAN  Urine micro and culture.  Bactrim DS po bid x 3 days.  Ok to continue AZO for 1 - 2 more days.  Hydrate well. To urgent care this weekend if has worsening of symptoms.  We discussed vaginal estrogen cream periurethrally for reduction of risk of UTI.  She will keep this in mind for the future  if needed. Fu prn and with Dr. Sabra Heck for annual exam in May, 2021.    An After Visit Summary  was printed and given to the patient.  _15_____ minutes face to face time of which over 50% was spent in counseling.

## 2019-02-21 NOTE — Patient Instructions (Signed)
Urinary Tract Infection, Adult A urinary tract infection (UTI) is an infection of any part of the urinary tract. The urinary tract includes:  The kidneys.  The ureters.  The bladder.  The urethra. These organs make, store, and get rid of pee (urine) in the body. What are the causes? This is caused by germs (bacteria) in your genital area. These germs grow and cause swelling (inflammation) of your urinary tract. What increases the risk? You are more likely to develop this condition if:  You have a small, thin tube (catheter) to drain pee.  You cannot control when you pee or poop (incontinence).  You are female, and: ? You use these methods to prevent pregnancy: ? A medicine that kills sperm (spermicide). ? A device that blocks sperm (diaphragm). ? You have low levels of a female hormone (estrogen). ? You are pregnant.  You have genes that add to your risk.  You are sexually active.  You take antibiotic medicines.  You have trouble peeing because of: ? A prostate that is bigger than normal, if you are female. ? A blockage in the part of your body that drains pee from the bladder (urethra). ? A kidney stone. ? A nerve condition that affects your bladder (neurogenic bladder). ? Not getting enough to drink. ? Not peeing often enough.  You have other conditions, such as: ? Diabetes. ? A weak disease-fighting system (immune system). ? Sickle cell disease. ? Gout. ? Injury of the spine. What are the signs or symptoms? Symptoms of this condition include:  Needing to pee right away (urgently).  Peeing often.  Peeing small amounts often.  Pain or burning when peeing.  Blood in the pee.  Pee that smells bad or not like normal.  Trouble peeing.  Pee that is cloudy.  Fluid coming from the vagina, if you are female.  Pain in the belly or lower back. Other symptoms include:  Throwing up (vomiting).  No urge to eat.  Feeling mixed up (confused).  Being tired  and grouchy (irritable).  A fever.  Watery poop (diarrhea). How is this treated? This condition may be treated with:  Antibiotic medicine.  Other medicines.  Drinking enough water. Follow these instructions at home:  Medicines  Take over-the-counter and prescription medicines only as told by your doctor.  If you were prescribed an antibiotic medicine, take it as told by your doctor. Do not stop taking it even if you start to feel better. General instructions  Make sure you: ? Pee until your bladder is empty. ? Do not hold pee for a long time. ? Empty your bladder after sex. ? Wipe from front to back after pooping if you are a female. Use each tissue one time when you wipe.  Drink enough fluid to keep your pee pale yellow.  Keep all follow-up visits as told by your doctor. This is important. Contact a doctor if:  You do not get better after 1-2 days.  Your symptoms go away and then come back. Get help right away if:  You have very bad back pain.  You have very bad pain in your lower belly.  You have a fever.  You are sick to your stomach (nauseous).  You are throwing up. Summary  A urinary tract infection (UTI) is an infection of any part of the urinary tract.  This condition is caused by germs in your genital area.  There are many risk factors for a UTI. These include having a small, thin   tube to drain pee and not being able to control when you pee or poop.  Treatment includes antibiotic medicines for germs.  Drink enough fluid to keep your pee pale yellow. This information is not intended to replace advice given to you by your health care provider. Make sure you discuss any questions you have with your health care provider. Document Released: 09/06/2007 Document Revised: 03/07/2018 Document Reviewed: 09/27/2017 Elsevier Patient Education  2020 Elsevier Inc.  

## 2019-02-22 LAB — URINALYSIS, MICROSCOPIC ONLY
Casts: NONE SEEN /lpf
WBC, UA: NONE SEEN /hpf (ref 0–5)

## 2019-02-23 LAB — URINE CULTURE

## 2019-02-24 ENCOUNTER — Encounter: Payer: Self-pay | Admitting: Obstetrics and Gynecology

## 2019-04-25 DIAGNOSIS — L820 Inflamed seborrheic keratosis: Secondary | ICD-10-CM | POA: Diagnosis not present

## 2019-04-25 DIAGNOSIS — D1801 Hemangioma of skin and subcutaneous tissue: Secondary | ICD-10-CM | POA: Diagnosis not present

## 2019-04-25 DIAGNOSIS — L814 Other melanin hyperpigmentation: Secondary | ICD-10-CM | POA: Diagnosis not present

## 2019-04-25 DIAGNOSIS — D229 Melanocytic nevi, unspecified: Secondary | ICD-10-CM | POA: Diagnosis not present

## 2019-06-26 DIAGNOSIS — M545 Low back pain: Secondary | ICD-10-CM | POA: Diagnosis not present

## 2019-07-02 DIAGNOSIS — M4316 Spondylolisthesis, lumbar region: Secondary | ICD-10-CM | POA: Diagnosis not present

## 2019-07-08 DIAGNOSIS — M4316 Spondylolisthesis, lumbar region: Secondary | ICD-10-CM | POA: Diagnosis not present

## 2019-07-10 DIAGNOSIS — M4316 Spondylolisthesis, lumbar region: Secondary | ICD-10-CM | POA: Diagnosis not present

## 2019-07-14 DIAGNOSIS — M4316 Spondylolisthesis, lumbar region: Secondary | ICD-10-CM | POA: Diagnosis not present

## 2019-07-21 DIAGNOSIS — M4316 Spondylolisthesis, lumbar region: Secondary | ICD-10-CM | POA: Diagnosis not present

## 2019-07-24 DIAGNOSIS — M4316 Spondylolisthesis, lumbar region: Secondary | ICD-10-CM | POA: Diagnosis not present

## 2019-07-31 DIAGNOSIS — M4316 Spondylolisthesis, lumbar region: Secondary | ICD-10-CM | POA: Diagnosis not present

## 2019-08-14 NOTE — Progress Notes (Signed)
54 y.o. G0P0000 Married [place] female here for annual exam.  Denies vaginal bleeding.  Had kidney stone in November.  Did have a little bleeding with this.  Has learned that she has a lot of urinary urgency (like a UTI is going to start) before she actually passes a stone.  Has seen urology.  Did get the Covid vaccination this year.   H/o right ovarian cyst with PUS 12/05/2018.  Follow up one year recommended.  Order has been placed for this follow up.  [name] last menstrual period was 06/01/2017 (approximate).          Sexually active: Yes.    The current method of family planning is post menopausal.    Exercising: No.  exercise Smoker:  no  Health Maintenance: Pap:  01-10-16 neg HPV HR neg, 04-23-2018 neg History of abnormal Pap:  no MMG:  06/2018 bilateral & left breast u/s birads 2:neg Colonoscopy:  cologuard neg 2018 BMD:   none TDaP:  2018 Pneumonia vaccine(s):  no Shingrix:   no Hep C testing: not done Screening Labs: will be done today   reports that she has quit smoking. She has never used smokeless tobacco. She reports current alcohol use. She reports that she does not use drugs.  Past Medical History:  Diagnosis Date  . HSV infection 2004   R Buttock  . Renal stones     History reviewed. No pertinent surgical history.  Current Outpatient Medications  Medication Sig Dispense Refill  . Multiple Vitamin (MULTIVITAMIN) tablet Take 1 tablet by mouth daily.     No current facility-administered medications for this visit.    Family History  Problem Relation Age of Onset  . Prostate cancer Father   . Breast cancer Sister 7       Genetics negative    Review of Systems  Constitutional: Negative.   HENT: Negative.   Eyes: Negative.   Respiratory: Negative.   Cardiovascular: Negative.   Gastrointestinal: Negative.   Endocrine: Negative.   Genitourinary: Negative.   Musculoskeletal: Negative.   Skin: Negative.   Allergic/Immunologic: Negative.    Neurological: Negative.   Hematological: Negative.   Psychiatric/Behavioral: Negative.     Exam:   BP 104/62   Pulse 70   Temp 97.6 F (36.4 C) (Skin)   Resp 16   Ht 5' 2.25" (1.581 m)   Wt 134 lb (60.8 kg)   LMP 06/01/2017 (Approximate)   BMI 24.31 kg/m   Height: 5' 2.25" (158.1 cm)  General appearance: alert, cooperative and appears stated age Head: Normocephalic, without obvious abnormality, atraumatic Neck: no adenopathy, supple, symmetrical, trachea midline and thyroid normal to inspection and palpation Lungs: clear to auscultation bilaterally Breasts: normal appearance, no masses or tenderness Heart: regular rate and rhythm Abdomen: soft, non-tender; bowel sounds normal; no masses,  no organomegaly Extremities: extremities normal, atraumatic, no cyanosis or edema Skin: Skin color, texture, turgor normal. No rashes or lesions Lymph nodes: Cervical, supraclavicular, and axillary nodes normal. No abnormal inguinal nodes palpated Neurologic: Grossly normal   Pelvic: External genitalia:  no lesions              Urethra:  normal appearing urethra with no masses, tenderness or lesions              Bartholins and Skenes: normal                 Vagina: normal appearing vagina with normal color and discharge, no lesions  Cervix: no lesions              Pap taken: Yes.   Bimanual Exam:  Uterus:  normal size, contour, position, consistency, mobility, non-tender              Adnexa: no mass, fullness, tenderness               Rectovaginal: Confirms               Anus:  normal sphincter tone, no lesions  Chaperone, Jill Craig, CMA, was present for exam.  A:  Well Woman with normal exam PMP, no HRT H/o nephrolithiasis Right ovarian cyst Family hx of breast cancer in her sister (diagnosed age 24 with negative genetic testing)   P:   Mammogram recommended yearly esp due to sister's hx.  Pt states she is going to decline doing MMG this year.  Does not desire  any additional screening pap smear with HR HPV obtained today CBC, CMP, Lipids, TSH, Vit D obtained today Declines additional vaccinations this year Colonoscopy referral made to Dr. Silverio Decamp (her husband as seen her as well) return annually or prn

## 2019-08-27 ENCOUNTER — Other Ambulatory Visit: Payer: Self-pay

## 2019-08-28 ENCOUNTER — Other Ambulatory Visit (HOSPITAL_COMMUNITY)
Admission: RE | Admit: 2019-08-28 | Discharge: 2019-08-28 | Disposition: A | Discharge status: Home / Self Care | Payer: BC Managed Care – PPO | Source: Ambulatory Visit | Attending: Obstetrics & Gynecology | Admitting: Obstetrics & Gynecology

## 2019-08-28 ENCOUNTER — Other Ambulatory Visit: Payer: Self-pay

## 2019-08-28 ENCOUNTER — Encounter: Payer: Self-pay | Admitting: Obstetrics & Gynecology

## 2019-08-28 ENCOUNTER — Ambulatory Visit (INDEPENDENT_AMBULATORY_CARE_PROVIDER_SITE_OTHER): Payer: BC Managed Care – PPO | Admitting: Obstetrics & Gynecology

## 2019-08-28 VITALS — BP 104/62 | HR 70 | Temp 97.6°F | Resp 16 | Ht 62.25 in | Wt 134.0 lb

## 2019-08-28 DIAGNOSIS — Z0000 Encounter for general adult medical examination without abnormal findings: Secondary | ICD-10-CM | POA: Diagnosis not present

## 2019-08-28 DIAGNOSIS — Z1211 Encounter for screening for malignant neoplasm of colon: Secondary | ICD-10-CM | POA: Diagnosis not present

## 2019-08-28 DIAGNOSIS — Z01419 Encounter for gynecological examination (general) (routine) without abnormal findings: Secondary | ICD-10-CM | POA: Diagnosis not present

## 2019-08-28 DIAGNOSIS — N200 Calculus of kidney: Secondary | ICD-10-CM

## 2019-08-28 DIAGNOSIS — Z803 Family history of malignant neoplasm of breast: Secondary | ICD-10-CM

## 2019-08-28 DIAGNOSIS — Z124 Encounter for screening for malignant neoplasm of cervix: Secondary | ICD-10-CM | POA: Diagnosis not present

## 2019-08-28 DIAGNOSIS — N83209 Unspecified ovarian cyst, unspecified side: Secondary | ICD-10-CM

## 2019-08-29 LAB — LIPID PANEL
Chol/HDL Ratio: 3.5 ratio (ref 0.0–4.4)
Cholesterol, Total: 225 mg/dL — ABNORMAL HIGH (ref 100–199)
HDL: 65 mg/dL (ref >39)
LDL Chol Calc (NIH): 145 mg/dL — ABNORMAL HIGH (ref 0–99)
Triglycerides: 83 mg/dL (ref 0–149)
VLDL Cholesterol Cal: 15 mg/dL (ref 5–40)

## 2019-08-29 LAB — CBC
Hematocrit: 35.9 % (ref 34.0–46.6)
Hemoglobin: 12.1 g/dL (ref 11.1–15.9)
MCH: 31.1 pg (ref 26.6–33.0)
MCHC: 33.7 g/dL (ref 31.5–35.7)
MCV: 92 fL (ref 79–97)
Platelets: 176 10*3/uL (ref 150–450)
RBC: 3.89 x10E6/uL (ref 3.77–5.28)
RDW: 12 % (ref 11.7–15.4)
WBC: 5.1 10*3/uL (ref 3.4–10.8)

## 2019-08-29 LAB — CYTOLOGY - PAP
Comment: NEGATIVE
Diagnosis: NEGATIVE
High risk HPV: NEGATIVE

## 2019-08-29 LAB — TSH: TSH: 2.37 u[IU]/mL (ref 0.450–4.500)

## 2019-09-02 ENCOUNTER — Telehealth: Payer: Self-pay | Admitting: Obstetrics & Gynecology

## 2019-09-02 NOTE — Telephone Encounter (Signed)
Spoke with patient regarding benefits for recommended ultrasound. Patient is aware that ultrasound is transvaginal. Patient acknowledges understanding of information presented. Patient is aware of cancellation policy. Patient scheduled appointment for 09/16/2019 at 330PM with M. Edwinna Areola, MD. Encounter closed.

## 2019-09-16 ENCOUNTER — Encounter: Payer: Self-pay | Admitting: Obstetrics & Gynecology

## 2019-09-16 ENCOUNTER — Ambulatory Visit (INDEPENDENT_AMBULATORY_CARE_PROVIDER_SITE_OTHER): Payer: BC Managed Care – PPO

## 2019-09-16 ENCOUNTER — Ambulatory Visit: Payer: BC Managed Care – PPO | Admitting: Obstetrics & Gynecology

## 2019-09-16 ENCOUNTER — Other Ambulatory Visit: Payer: Self-pay | Admitting: Obstetrics & Gynecology

## 2019-09-16 ENCOUNTER — Other Ambulatory Visit: Payer: Self-pay

## 2019-09-16 VITALS — BP 96/62 | HR 68 | Temp 97.7°F | Resp 16 | Wt 138.0 lb

## 2019-09-16 DIAGNOSIS — N83209 Unspecified ovarian cyst, unspecified side: Secondary | ICD-10-CM

## 2019-09-16 DIAGNOSIS — D252 Subserosal leiomyoma of uterus: Secondary | ICD-10-CM

## 2019-09-16 DIAGNOSIS — D4959 Neoplasm of unspecified behavior of other genitourinary organ: Secondary | ICD-10-CM | POA: Diagnosis not present

## 2019-09-16 DIAGNOSIS — Z803 Family history of malignant neoplasm of breast: Secondary | ICD-10-CM

## 2019-09-16 NOTE — Progress Notes (Signed)
54 y.o. [place] Married Unavailable female here for pelvic ultrasound due to recheck left ovarian cyst.  Denies pain.  Denies vaginal bleeding.  [name] last menstrual period was 06/01/2017 (approximate).  Contraception: PMP  Findings:  UTERUS: 5.2 x 3.5 x 2.1cm with 46mm subserosal fibroid EMS: 1.45mm ADNEXA: Left ovary: 4.3 x 3.4 x 2.8cm with 3.7 x 2.9 x 2.9cm simple appearing ovarian cyst, stable, avascular       Right ovary:  2.8 x 1.3 x 0.9cm CUL DE SAC: no free fluid  Discussion:  Ultrasonographer supervised.  Ultrasound images reviewed.  Comparison with prior ultrasound made.  Pt aware this ovarian finding has completley benign appearance.  Ca-125 recommended today and follow up imaging discussed to confirm this is benign.  Pt does not want to continue follow-up ultrasounds and desires removal.  Is aware that it is safe to follow but does not want to continue to worry.  D/w pt laparoscopic removal of left ovary and fallopian tube.  Pt desires to keep right ovary so removal of this fallopian tube also discussed.  Procedure discussed  Risks and benefits removed including bleeding, infection, transfusion risk, bowel/bladder/uretral injury all discussed.  Pt comfortable with all of this and would like to plan for later in the summer.  Will await ca-125 level first.  Assessment:  ~4cm left ovarian cyst, probable cystadenoma Subserosal fibroid  Plan:  Ca-125 pending.  If normal, will proceed with scheduling laparoscopic LSO with right salpingectomy.    About 30 minutes spent with pt in face to face discussion.

## 2019-09-17 LAB — CA 125: Cancer Antigen (CA) 125: 10.2 U/mL (ref 0.0–38.1)

## 2019-09-25 ENCOUNTER — Telehealth: Payer: Self-pay | Admitting: Obstetrics & Gynecology

## 2019-09-25 NOTE — Telephone Encounter (Signed)
Spoke with patient regarding surgery benefits. Patient acknowledges understanding of information presented. Patient is aware that benefits presented are for professional benefits only. Patient is aware that once surgery is scheduled, the hospital will call with separate benefits. Patient given phone number to pre service center to call and get estimated hospital benefits. Patient is aware of surgery cancellation policy.  Patient to call back to proceed with surgery scheduling after speaking with husband.

## 2019-10-09 NOTE — Telephone Encounter (Signed)
Spoke with patient. Patient would like to proceed with Lap LSO, RS on 11/25/2019. Surgery scheduled for 11/25/2019 at 10:30 am at [facility]. Pre op scheduled for 11/11/2019 at 4:30 pm with Dr.Miller. Per the new [facility] guidelines since she is fully vaccinated as long as she is asymptomatic, the patient does not need to  follow 4 day pre-op quarantine. If she is fully vaccinated, asymptomatic, and has not been exposed to anyone with  COVID in the past 14 days, pre-op COVID testing is not required or recommended. Patient verbalizes understanding and will notify the office if she develops any COVID symptoms or is exposed to [place] prior to surgery. 2 week post op scheduled for 11/11/2019 at 4:30 pm with Dr.Miller. Surgery instructions reviewed and mailed to patient's verified home address on file.   Routing to provider and will close encounter.

## 2019-10-13 ENCOUNTER — Telehealth: Payer: Self-pay | Admitting: *Deleted

## 2019-10-13 NOTE — Telephone Encounter (Signed)
Left message on voicemail to call and reschedule cancelled appointment. °

## 2019-11-04 ENCOUNTER — Ambulatory Visit (INDEPENDENT_AMBULATORY_CARE_PROVIDER_SITE_OTHER): Payer: BC Managed Care – PPO | Admitting: Obstetrics & Gynecology

## 2019-11-04 ENCOUNTER — Other Ambulatory Visit: Payer: Self-pay | Admitting: Obstetrics & Gynecology

## 2019-11-04 ENCOUNTER — Encounter: Payer: Self-pay | Admitting: Obstetrics & Gynecology

## 2019-11-04 ENCOUNTER — Other Ambulatory Visit: Payer: Self-pay

## 2019-11-04 ENCOUNTER — Telehealth: Payer: Self-pay | Admitting: Obstetrics & Gynecology

## 2019-11-04 VITALS — BP 104/62 | HR 68 | Resp 16 | Wt 139.0 lb

## 2019-11-04 DIAGNOSIS — N83202 Unspecified ovarian cyst, left side: Secondary | ICD-10-CM

## 2019-11-04 NOTE — Telephone Encounter (Signed)
Received communication from [facility] that patient has not responded to three phone calls to schedule appt and referral has been closed.  Specifically, note reads: 3rd attempt to contact pt with no response  Will close referral but can be reopened if pt calls back

## 2019-11-04 NOTE — Progress Notes (Signed)
54 y.o. G69P0000 Married  female here for discussion of upcoming procedure.  Laparoscopic LSA and right salpingectomy planned due to 4cm simple cyst that has been persistent over the past 10 months.  Although this has benign appearance and ca 125 level of 10.2, she does not want to continue watching this.  She desires removal.    Procedure discussed with patient.  Hospital stay, recovery and pain management all discussed.  Risks discussed including but not limited to bleeding, <1% risk of receiving a  transfusion, infection, <1% risk of bowel/bladder/ureteral/vascular injury discussed as well as possible need for additional surgery if injury does occur discussed.  DVT/PE and rare risk of death discussed.  My actual complications with prior surgeries discussed.  Positioning and incision locations discussed.  Patient aware if pathology abnormal she may need additional treatment.  All questions answered.  She is aware this could continue to be followed by ultrasound but she does not want to do this.    Ob Hx:   [name] last menstrual period was 06/01/2017 (approximate).          Sexually active: Yes.   Birth control: PMP Last pap: 08/27/2019 Last MMG: 07/2018 Tobacco: non smoker  History reviewed. No pertinent surgical history.  Past Medical History:  Diagnosis Date  . HSV infection 2004   R Buttock  . Renal stones     Allergies: Patient has no known allergies.  Current Outpatient Medications  Medication Sig Dispense Refill  . Multiple Vitamin (MULTIVITAMIN) tablet Take 1 tablet by mouth daily.     No current facility-administered medications for this visit.    ROS: Pertinent items noted in HPI and remainder of comprehensive ROS otherwise negative.  Exam:    BP 104/62   Pulse 68   Resp 16   Wt 139 lb (63 kg)   LMP 06/01/2017 (Approximate)   BMI 25.22 kg/m   General appearance: alert and cooperative Head: Normocephalic, without obvious abnormality, atraumatic Lungs: clear to  auscultation bilaterally Heart: regular rate and rhythm, S1, S2 normal, no murmur, click, rub or gallop Abdomen: soft, non-tender; bowel sounds normal; no masses,  no organomegaly Extremities: extremities normal, atraumatic, no cyanosis or edema Skin: Skin color, texture, turgor normal. No rashes or lesions no inguinal nodes palpated Neurologic: Grossly normal   A: 4cm right ovarian cyst, persistent over 10 months H/o renal stones  P:  Laparoscopic LSO and right salpingecotmy planned Pre and post op instructions reviewed.  Post op activity discussed Post op pain management reviewed as well.

## 2019-11-07 ENCOUNTER — Telehealth: Payer: Self-pay

## 2019-11-07 NOTE — Telephone Encounter (Signed)
Patient is calling in regards to her surgery covid testing. Patient would like to reschedule the day of the test.

## 2019-11-07 NOTE — Telephone Encounter (Signed)
Per review of Epic, patient is scheduled for surgery on 11/25/19.   Routing to [employer], RN for return call.

## 2019-11-10 NOTE — Telephone Encounter (Signed)
Spoke with patient. COVID testing moved to 8 am on 11/21/2019 at [facility] location. Patient is agreeable to time change of appointment. Encounter closed.

## 2019-11-11 ENCOUNTER — Ambulatory Visit: Payer: BC Managed Care – PPO | Admitting: Obstetrics & Gynecology

## 2019-11-17 ENCOUNTER — Encounter (HOSPITAL_BASED_OUTPATIENT_CLINIC_OR_DEPARTMENT_OTHER): Payer: Self-pay | Admitting: Obstetrics & Gynecology

## 2019-11-17 ENCOUNTER — Other Ambulatory Visit: Payer: Self-pay

## 2019-11-17 NOTE — Progress Notes (Signed)
Spoke w/ via phone for pre-op interview---PT Lab needs dos----  cbc             Lab results------echo 12-07-2016 epic, lov dr berry 01-16-2017 for chest pain, none since then per patient, dr berry note states to follow up prn COVID test ------11-21-2019 at 800 am Arrive at -------845 am 11-25-2019 NPO after MN NO Solid Food.  Clear liquids from MN until---745 am then npo Medications to take morning of surgery -----none Diabetic medication -----n/a Patient Special Instructions -----none Pre-Op special Istructions -----none Patient verbalized understanding of instructions that were given at this phone interview. Patient denies shortness of breath, chest pain, fever, cough at this phone interview.

## 2019-11-21 ENCOUNTER — Other Ambulatory Visit (HOSPITAL_COMMUNITY)
Admission: RE | Admit: 2019-11-21 | Discharge: 2019-11-21 | Disposition: A | Discharge status: Home / Self Care | Payer: BC Managed Care – PPO | Source: Ambulatory Visit | Attending: Obstetrics & Gynecology | Admitting: Obstetrics & Gynecology

## 2019-11-21 ENCOUNTER — Other Ambulatory Visit (HOSPITAL_COMMUNITY): Payer: Self-pay

## 2019-11-21 DIAGNOSIS — Z20822 Contact with and (suspected) exposure to covid-19: Secondary | ICD-10-CM | POA: Insufficient documentation

## 2019-11-21 DIAGNOSIS — Z01812 Encounter for preprocedural laboratory examination: Secondary | ICD-10-CM | POA: Diagnosis not present

## 2019-11-21 LAB — SARS CORONAVIRUS 2 (TAT 6-24 HRS): SARS Coronavirus 2: NEGATIVE

## 2019-11-25 ENCOUNTER — Ambulatory Visit (HOSPITAL_BASED_OUTPATIENT_CLINIC_OR_DEPARTMENT_OTHER): Payer: BC Managed Care – PPO | Admitting: Certified Registered"

## 2019-11-25 ENCOUNTER — Encounter (HOSPITAL_BASED_OUTPATIENT_CLINIC_OR_DEPARTMENT_OTHER)
Admission: RE | Disposition: A | Discharge status: Home / Self Care | Payer: Self-pay | Source: Home / Self Care | Attending: Obstetrics & Gynecology

## 2019-11-25 ENCOUNTER — Ambulatory Visit (HOSPITAL_BASED_OUTPATIENT_CLINIC_OR_DEPARTMENT_OTHER)
Admission: RE | Admit: 2019-11-25 | Discharge: 2019-11-25 | Disposition: A | Discharge status: Home / Self Care | Payer: BC Managed Care – PPO | Attending: Obstetrics & Gynecology | Admitting: Obstetrics & Gynecology

## 2019-11-25 ENCOUNTER — Encounter (HOSPITAL_BASED_OUTPATIENT_CLINIC_OR_DEPARTMENT_OTHER): Payer: Self-pay | Admitting: Obstetrics & Gynecology

## 2019-11-25 ENCOUNTER — Other Ambulatory Visit: Payer: Self-pay

## 2019-11-25 DIAGNOSIS — N6019 Diffuse cystic mastopathy of unspecified breast: Secondary | ICD-10-CM | POA: Diagnosis not present

## 2019-11-25 DIAGNOSIS — Z87891 Personal history of nicotine dependence: Secondary | ICD-10-CM | POA: Insufficient documentation

## 2019-11-25 DIAGNOSIS — D259 Leiomyoma of uterus, unspecified: Secondary | ICD-10-CM | POA: Insufficient documentation

## 2019-11-25 DIAGNOSIS — D271 Benign neoplasm of left ovary: Secondary | ICD-10-CM | POA: Diagnosis not present

## 2019-11-25 DIAGNOSIS — N838 Other noninflammatory disorders of ovary, fallopian tube and broad ligament: Secondary | ICD-10-CM | POA: Diagnosis not present

## 2019-11-25 DIAGNOSIS — N289 Disorder of kidney and ureter, unspecified: Secondary | ICD-10-CM | POA: Diagnosis not present

## 2019-11-25 DIAGNOSIS — N83202 Unspecified ovarian cyst, left side: Secondary | ICD-10-CM | POA: Diagnosis not present

## 2019-11-25 DIAGNOSIS — N200 Calculus of kidney: Secondary | ICD-10-CM | POA: Diagnosis not present

## 2019-11-25 HISTORY — DX: Personal history of urinary calculi: Z87.442

## 2019-11-25 HISTORY — PX: LAPAROSCOPIC BILATERAL SALPINGECTOMY: SHX5889

## 2019-11-25 LAB — CBC
HCT: 37.6 % (ref 36.0–46.0)
Hemoglobin: 12 g/dL (ref 12.0–15.0)
MCH: 29.6 pg (ref 26.0–34.0)
MCHC: 31.9 g/dL (ref 30.0–36.0)
MCV: 92.6 fL (ref 80.0–100.0)
Platelets: 185 10*3/uL (ref 150–400)
RBC: 4.06 MIL/uL (ref 3.87–5.11)
RDW: 13 % (ref 11.5–15.5)
WBC: 5.6 10*3/uL (ref 4.0–10.5)
nRBC: 0 % (ref 0.0–0.2)

## 2019-11-25 SURGERY — SALPINGECTOMY, BILATERAL, LAPAROSCOPIC
Anesthesia: General | Site: Abdomen | Laterality: Left

## 2019-11-25 MED ORDER — ONDANSETRON HCL 4 MG/2ML IJ SOLN
INTRAMUSCULAR | Status: DC | PRN
  Administered 2019-11-25: 4 mg via INTRAVENOUS

## 2019-11-25 MED ORDER — HYDROCODONE-ACETAMINOPHEN 5-325 MG PO TABS
1.0000 | ORAL_TABLET | Freq: Four times a day (QID) | ORAL | 0 refills | Status: DC | PRN
Start: 2019-11-25 — End: 2019-12-11

## 2019-11-25 MED ORDER — MIDAZOLAM HCL 2 MG/2ML IJ SOLN
INTRAMUSCULAR | Status: AC
  Filled 2019-11-25: qty 2

## 2019-11-25 MED ORDER — PROPOFOL 10 MG/ML IV BOLUS
INTRAVENOUS | Status: AC
  Filled 2019-11-25: qty 20

## 2019-11-25 MED ORDER — FENTANYL CITRATE (PF) 100 MCG/2ML IJ SOLN
INTRAMUSCULAR | Status: DC | PRN
Start: 2019-11-25 — End: 2019-11-25
  Administered 2019-11-25 (×2): 50 ug via INTRAVENOUS
  Administered 2019-11-25: 100 ug via INTRAVENOUS

## 2019-11-25 MED ORDER — ACETAMINOPHEN 500 MG PO TABS: 1000.0000 mg | ORAL_TABLET | Freq: Once | ORAL | Status: DC | PRN

## 2019-11-25 MED ORDER — OXYCODONE HCL 5 MG/5ML PO SOLN: 5.0000 mg | Freq: Once | ORAL | Status: AC | PRN

## 2019-11-25 MED ORDER — SCOPOLAMINE 1 MG/3DAYS TD PT72
MEDICATED_PATCH | TRANSDERMAL | Status: DC | PRN
  Administered 2019-11-25: 1 via TRANSDERMAL

## 2019-11-25 MED ORDER — ACETAMINOPHEN 160 MG/5ML PO SOLN: 1000.0000 mg | Freq: Once | ORAL | Status: DC | PRN

## 2019-11-25 MED ORDER — ONDANSETRON HCL 4 MG/2ML IJ SOLN
INTRAMUSCULAR | Status: AC
  Filled 2019-11-25: qty 2

## 2019-11-25 MED ORDER — LIDOCAINE 2% (20 MG/ML) 5 ML SYRINGE
INTRAMUSCULAR | Status: DC | PRN
  Administered 2019-11-25: 60 mg via INTRAVENOUS

## 2019-11-25 MED ORDER — IBUPROFEN 800 MG PO TABS: 800.0000 mg | ORAL_TABLET | Freq: Three times a day (TID) | ORAL | 0 refills | Status: DC | PRN

## 2019-11-25 MED ORDER — DEXAMETHASONE SODIUM PHOSPHATE 10 MG/ML IJ SOLN
INTRAMUSCULAR | Status: DC | PRN
  Administered 2019-11-25: 10 mg via INTRAVENOUS

## 2019-11-25 MED ORDER — FENTANYL CITRATE (PF) 100 MCG/2ML IJ SOLN
INTRAMUSCULAR | Status: AC
  Filled 2019-11-25: qty 2

## 2019-11-25 MED ORDER — ROCURONIUM BROMIDE 10 MG/ML (PF) SYRINGE
PREFILLED_SYRINGE | INTRAVENOUS | Status: DC | PRN
  Administered 2019-11-25: 40 mg via INTRAVENOUS

## 2019-11-25 MED ORDER — DEXAMETHASONE SODIUM PHOSPHATE 10 MG/ML IJ SOLN
INTRAMUSCULAR | Status: AC
  Filled 2019-11-25: qty 1

## 2019-11-25 MED ORDER — ROCURONIUM BROMIDE 10 MG/ML (PF) SYRINGE
PREFILLED_SYRINGE | INTRAVENOUS | Status: AC
  Filled 2019-11-25: qty 10

## 2019-11-25 MED ORDER — BUPIVACAINE HCL 0.25 % IJ SOLN
INTRAMUSCULAR | Status: DC | PRN
  Administered 2019-11-25: 8 mL

## 2019-11-25 MED ORDER — ACETAMINOPHEN 10 MG/ML IV SOLN
INTRAVENOUS | Status: AC
  Filled 2019-11-25: qty 100

## 2019-11-25 MED ORDER — OXYCODONE HCL 5 MG PO TABS
5.0000 mg | ORAL_TABLET | Freq: Once | ORAL | Status: AC | PRN
  Administered 2019-11-25: 5 mg via ORAL

## 2019-11-25 MED ORDER — OXYCODONE HCL 5 MG PO TABS
ORAL_TABLET | ORAL | Status: AC
  Filled 2019-11-25: qty 1

## 2019-11-25 MED ORDER — SUGAMMADEX SODIUM 200 MG/2ML IV SOLN
INTRAVENOUS | Status: DC | PRN
  Administered 2019-11-25: 150 mg via INTRAVENOUS

## 2019-11-25 MED ORDER — PROPOFOL 10 MG/ML IV BOLUS
INTRAVENOUS | Status: DC | PRN
  Administered 2019-11-25: 140 mg via INTRAVENOUS

## 2019-11-25 MED ORDER — FENTANYL CITRATE (PF) 100 MCG/2ML IJ SOLN
25.0000 ug | INTRAMUSCULAR | Status: DC | PRN
  Administered 2019-11-25: 50 ug via INTRAVENOUS
  Administered 2019-11-25: 25 ug via INTRAVENOUS

## 2019-11-25 MED ORDER — ACETAMINOPHEN 10 MG/ML IV SOLN
1000.0000 mg | Freq: Once | INTRAVENOUS | Status: DC | PRN
  Administered 2019-11-25: 1000 mg via INTRAVENOUS

## 2019-11-25 MED ORDER — FENTANYL CITRATE (PF) 250 MCG/5ML IJ SOLN
INTRAMUSCULAR | Status: AC
  Filled 2019-11-25: qty 5

## 2019-11-25 MED ORDER — LACTATED RINGERS IV SOLN: INTRAVENOUS | Status: DC

## 2019-11-25 MED ORDER — MIDAZOLAM HCL 5 MG/5ML IJ SOLN
INTRAMUSCULAR | Status: DC | PRN
  Administered 2019-11-25: 2 mg via INTRAVENOUS

## 2019-11-25 MED ORDER — LIDOCAINE 2% (20 MG/ML) 5 ML SYRINGE
INTRAMUSCULAR | Status: AC
  Filled 2019-11-25: qty 5

## 2019-11-25 SURGICAL SUPPLY — 70 items
ADH SKN CLS APL DERMABOND .7 (GAUZE/BANDAGES/DRESSINGS) ×2
APL SKNCLS STERI-STRIP NONHPOA (GAUZE/BANDAGES/DRESSINGS)
APL SRG 38 LTWT LNG FL B (MISCELLANEOUS)
APPLICATOR ARISTA FLEXITIP XL (MISCELLANEOUS) IMPLANT
BAG RETRIEVAL 10 (BASKET)
BENZOIN TINCTURE PRP APPL 2/3 (GAUZE/BANDAGES/DRESSINGS) IMPLANT
BLADE CLIPPER SENSICLIP SURGIC (BLADE) IMPLANT
CABLE HIGH FREQUENCY MONO STRZ (ELECTRODE) IMPLANT
CANISTER SUCT 1200ML W/VALVE (MISCELLANEOUS) IMPLANT
CANISTER SUCT 3000ML PPV (MISCELLANEOUS) IMPLANT
CATH ROBINSON RED A/P 16FR (CATHETERS) IMPLANT
COVER MAYO STAND STRL (DRAPES) ×3 IMPLANT
COVER WAND RF STERILE (DRAPES) ×3 IMPLANT
DECANTER SPIKE VIAL GLASS SM (MISCELLANEOUS) ×3 IMPLANT
DERMABOND ADVANCED (GAUZE/BANDAGES/DRESSINGS) ×1
DERMABOND ADVANCED .7 DNX12 (GAUZE/BANDAGES/DRESSINGS) ×2 IMPLANT
DRSG COVADERM PLUS 2X2 (GAUZE/BANDAGES/DRESSINGS) IMPLANT
DRSG OPSITE POSTOP 3X4 (GAUZE/BANDAGES/DRESSINGS) IMPLANT
DRSG TELFA 3X8 NADH (GAUZE/BANDAGES/DRESSINGS) IMPLANT
DURAPREP 26ML APPLICATOR (WOUND CARE) ×3 IMPLANT
GAUZE 4X4 16PLY RFD (DISPOSABLE) ×6 IMPLANT
GLOVE BIOGEL PI IND STRL 7.0 (GLOVE) ×6 IMPLANT
GLOVE BIOGEL PI INDICATOR 7.0 (GLOVE) ×3
GLOVE ECLIPSE 6.5 STRL STRAW (GLOVE) ×6 IMPLANT
GOWN STRL REUS W/TWL LRG LVL3 (GOWN DISPOSABLE) ×6 IMPLANT
HEMOSTAT ARISTA ABSORB 3G PWDR (HEMOSTASIS) IMPLANT
KIT TURNOVER CYSTO (KITS) ×3 IMPLANT
LIGASURE VESSEL 5MM BLUNT TIP (ELECTROSURGICAL) IMPLANT
NDL HYPO 25X1 1.5 SAFETY (NEEDLE) ×2 IMPLANT
NDL INSUFFLATION 14GA 150MM (NEEDLE) IMPLANT
NEEDLE HYPO 25X1 1.5 SAFETY (NEEDLE) ×3 IMPLANT
NEEDLE INSUFFLATION 120MM (ENDOMECHANICALS) ×3 IMPLANT
NEEDLE INSUFFLATION 14GA 150MM (NEEDLE) IMPLANT
NS IRRIG 500ML POUR BTL (IV SOLUTION) ×3 IMPLANT
PACK LAPAROSCOPY BASIN (CUSTOM PROCEDURE TRAY) ×3 IMPLANT
PACK TRENDGUARD 450 HYBRID PRO (MISCELLANEOUS) ×2 IMPLANT
PAD DRESSING TELFA 3X8 NADH (GAUZE/BANDAGES/DRESSINGS) IMPLANT
PAD OB MATERNITY 4.3X12.25 (PERSONAL CARE ITEMS) ×3 IMPLANT
POUCH LAPAROSCOPIC INSTRUMENT (MISCELLANEOUS) ×3 IMPLANT
PROTECTOR NERVE ULNAR (MISCELLANEOUS) ×3 IMPLANT
SCISSORS LAP 5X35 DISP (ENDOMECHANICALS) IMPLANT
SEALER TISSUE G2 CVD JAW 35 (ENDOMECHANICALS) IMPLANT
SEALER TISSUE G2 CVD JAW 45CM (ENDOMECHANICALS)
SET SUCTION IRRIG HYDROSURG (IRRIGATION / IRRIGATOR) IMPLANT
SET TUBE SMOKE EVAC HIGH FLOW (TUBING) ×3 IMPLANT
SHEARS HARMONIC ACE PLUS 36CM (ENDOMECHANICALS) IMPLANT
SLEEVE ADV FIXATION 5X100MM (TROCAR) ×3 IMPLANT
SOLUTION ELECTROLUBE (MISCELLANEOUS) IMPLANT
STRIP CLOSURE SKIN 1/4X4 (GAUZE/BANDAGES/DRESSINGS) IMPLANT
SUT VIC AB 4-0 SH 27 (SUTURE)
SUT VIC AB 4-0 SH 27XANBCTRL (SUTURE) IMPLANT
SUT VICRYL 0 UR6 27IN ABS (SUTURE) IMPLANT
SUT VICRYL 4-0 PS2 18IN ABS (SUTURE) ×3 IMPLANT
SYR 10ML LL (SYRINGE) ×3 IMPLANT
SYR 30ML LL (SYRINGE) IMPLANT
SYR 3ML 23GX1 SAFETY (SYRINGE) IMPLANT
SYS BAG RETRIEVAL 10MM (BASKET)
SYSTEM BAG RETRIEVAL 10MM (BASKET) IMPLANT
SYSTEM CARTER THOMASON II (TROCAR) ×1 IMPLANT
TOWEL OR 17X26 10 PK STRL BLUE (TOWEL DISPOSABLE) ×3 IMPLANT
TRAP SPECIMEN MUCUS 40CC (MISCELLANEOUS) ×1 IMPLANT
TRAY FOLEY W/BAG SLVR 14FR (SET/KITS/TRAYS/PACK) IMPLANT
TRENDGUARD 450 HYBRID PRO PACK (MISCELLANEOUS) ×3
TROCAR ADV FIXATION 5X100MM (TROCAR) ×3 IMPLANT
TROCAR BLADELESS OPT 5 100 (ENDOMECHANICALS) ×3 IMPLANT
TROCAR XCEL NON BLADE 8MM B8LT (ENDOMECHANICALS) IMPLANT
TROCAR XCEL NON-BLD 11X100MML (ENDOMECHANICALS) ×1 IMPLANT
TUBE CONNECTING 12X1/4 (SUCTIONS) IMPLANT
WARMER LAPAROSCOPE (MISCELLANEOUS) ×3 IMPLANT
WATER STERILE IRR 500ML POUR (IV SOLUTION) ×3 IMPLANT

## 2019-11-25 NOTE — Discharge Instructions (Addendum)
Post-surgical Instructions, Outpatient Surgery  You may expect to feel dizzy, weak, and drowsy for as long as 24 hours after receiving the medicine that made you sleep (anesthetic). For the first 24 hours after your surgery:    Do not drive a car, ride a bicycle, participate in physical activities, or take public transportation until you are done taking narcotic pain medicines or as directed by Dr. Sabra Heck.   Do not drink alcohol or take tranquilizers.   Do not take medicine that has not been prescribed by your physicians.   Do not sign important papers or make important decisions while on narcotic pain medicines.   Have a responsible person with you.   CARE OF INCISION  If you have a bandage, you may remove it in one day.  If there are steri-strips or dermabond, just let this loosen on its own.   You may shower on the first day after your surgery.  Do not sit in a tub bath for one week.  Avoid heavy lifting (more than 10 pounds/4.5 kilograms), pushing, or pulling.   Avoid activities that may risk injury to your incisions.   PAIN MANAGEMENT  Motrin 800mg .  (This is the same as 4-200mg  over the counter tablets of Motrin or ibuprofen.)  You may take this every eight hours or as needed for cramping.  You can also take two 500mg  Tylenol tablets every six hours as long as you are not taking the narcotic pain medication as this also contains tylenol.  If you want to take motrin and tylenol together, you can take 400mg  of motrin and 500mg  of tylenol every 4 hours.  Again, this can be taken as long as you are not taking any of the narcotic because it also contains tylenol.    Vicodin 5/325mg .  For more severe pain, take one or two tablets every four to six hours as needed for pain control.  (Remember that narcotic pain medications increase your risk of constipation.  If this becomes a problem, you may take an over the counter stool softener like Colace 100mg  up to four times a day.)  DO'S AND  DON'T'S  Do not take a tub bath for one week.  You may shower on the first day after your surgery  Do not do any heavy lifting for one to two weeks.  This increases the chance of bleeding.  Do move around as you feel able.  Stairs are fine.  You may begin to exercise again as you feel able.  Do not lift any weights for two weeks.  Do not put anything in the vagina for two weeks--no tampons, intercourse, or douching.    REGULAR MEDIATIONS/VITAMINS:  You may restart all of your regular medications as prescribed.  You may restart all of your vitamins as you normally take them.    PLEASE CALL OR SEEK MEDICAL CARE IF:  You have persistent nausea and vomiting.   You have trouble eating or drinking.   You have an oral temperature above 100.5.   You have constipation that is not helped by adjusting diet or increasing fluid intake. Pain medicines are a common cause of constipation.   You have heavy vaginal bleeding  You have redness or drainage from your incision(s) or there is increasing pain or tenderness near or in the surgical site.    Post Anesthesia Home Care Instructions  Activity: Get plenty of rest for the remainder of the day. A responsible individual must stay with you for 24  hours following the procedure.  For the next 24 hours, DO NOT: -Drive a car -Paediatric nurse -Drink alcoholic beverages -Take any medication unless instructed by your physician -Make any legal decisions or sign important papers.  Meals: Start with liquid foods such as gelatin or soup. Progress to regular foods as tolerated. Avoid greasy, spicy, heavy foods. If nausea and/or vomiting occur, drink only clear liquids until the nausea and/or vomiting subsides. Call your physician if vomiting continues.  Special Instructions/Symptoms: Your throat may feel dry or sore from the anesthesia or the breathing tube placed in your throat during surgery. If this causes discomfort, gargle with warm salt water.  The discomfort should disappear within 24 hours.  If you had a scopolamine patch placed behind your ear for the management of post- operative nausea and/or vomiting:  1. The medication in the patch is effective for 72 hours, after which it should be removed.  Wrap patch in a tissue and discard in the trash. Wash hands thoroughly with soap and water. 2. You may remove the patch earlier than 72 hours if you experience unpleasant side effects which may include dry mouth, dizziness or visual disturbances. 3. Avoid touching the patch. Wash your hands with soap and water after contact with the patch.

## 2019-11-25 NOTE — Op Note (Signed)
11/25/2019  11:38 AM  PATIENT:  Jill Craig  54 y.o. female  PRE-OPERATIVE DIAGNOSIS:  Left cyst/mass of ovary  POST-OPERATIVE DIAGNOSIS:  Left cyst/mass of ovary  PROCEDURE:  Procedure(s): LAPAROSCOPIC BILATERAL SALPINGECTOMY LAPAROSCOPIC LEFT OOPHORECTOMY  SURGEON:  [name]  ASSISTANTS: Sumner Boast, MD   ANESTHESIA:   general  ESTIMATED BLOOD LOSS: 10 mL  BLOOD ADMINISTERED:none   FLUIDS: 800cc LR  UOP: 150cc clear UOP  SPECIMEN:  Left fallopian tube and left ovary, right fallopian tube, pelvic washings  DISPOSITION OF SPECIMEN:  PATHOLOGY  FINDINGS: left ovarian cyst about 4-5cm, mobile and smooth, small pedunculated fibroids  DESCRIPTION OF OPERATION: Patient is taken to the operating room. She is placed in the supine position. She is a running IV in place. Informed consent was present on the chart. SCDs on her lower extremities and functioning properly. Patient was positioned while she was awake.  Her legs were placed in the low lithotomy position in [place]. Her arms were tucked by the side.  General endotracheal anesthesia was administered by the anesthesia staff without difficulty. Dr. Ermalene Postin, anesthesia, oversaw case.  Time out performed.    Dura prep was then used to prep the abdomen and Betadine was used to prep the inner thighs, perineum and vagina. Once 3 minutes had past the patient was draped in a normal standard fashion. The legs were lifted to the high lithotomy position. The cervix was visualized by placing a Graves speculum in the posterior aspect of the vagina. The anterior lip of the cervix was grasped with single-tooth tenaculum.  Acorn uterine maniuplator placed at cervical os and attached to the tenaculum.  Speculum was removed.  Foley catheter was placed to straight drain.  Clear urine noted.  Legs were lowered to the low lithotomy position and attention was turned the abdomen.  The umbilicus was everted.  A Veress needle was obtained.  Syringe of sterile saline was placed on a open Veress needle.  This was passed into the umbilicus until just when the fluid started to drip.  Then low flow CO2 gas was attached the needle and the pneumoperitoneum was achieved without difficulty. Once 3 liters of gas was in the abdomen the Veress needle was removed and a 5 millimeter non-bladed Optiview trocar and port were passed directly to the abdomen. The laparoscope was then used to confirm intraperitoneal placement. Findings noted above.  Upper abdomen was surveyed as well as the pelvis.  Locations for RLQ and LLQ ports were noted by transillumination of the abdominal wall.  0.25% marcaine was used to anesthetize the skin.  49mm skin incision was made in the LLQ and a 11mm skin incision was made in the RLQ.  A #11 nonbladed trochar was placed in the LLQ and port and a 74mm nonbladed trochar and port was placed in the RLQ.  All trochars were removed.    Ureters were identifies.  Washings were obtained.  Attention was turned to the left side. With uterus on stretch the left IP ligament was serially clamped, cauterized and incised.  Then the left mesosalpinx was serially clamped, cauterized and incised.  Finally the utero-ovarian pedicle was serially clamped cauterized close to the uterus completely removing the left tube and ovary.  This was placed in the pelvis.     Attention was turned the right side.  The uterus was placed on stretch to the opposite side.  The tube was excised off the ovary using the Ligasure device.  The mesosalpinx was incised  freeing the tube.  Then the right tube was clamped, cauterized and incised close to the uterus.  This tube was removed from the pelvis.    Next an endocatch bag was placed in the pelvis and the left tube and ovary placed in the bag.  The port was removed and the bag was brought to the surface.  The cyst was opened in the bag with a #11 blade.  Then the ovary could easily be removed in the bag.  No spill in the  pelvis occurred.    At this point, the procedure was ended.  The CO2 pressure was lowered to 16mm Hg.  No bleeding was noted along any pedicles.  Pelvis was irrigated and irrigant removed.  As this point, the LLQ port was removed and incision closed with Burley Saver close device with #0 Vicryl.  Then the pneumoperitoneum was relieved and the midline port removed.  Several deep breaths were given to try and get rid of any gas in the pelvis.  The RLQ port was removed.  Incisions were closed with subcuticular stitches of 3-0 Vicryl. The skin was cleansed Dermabond was applied. Attention was then turned the vagina and the acorn uterine manipulator and tenaculum was removed.  There was a small amount of bleeding from the tenaculum site.  cuff was inspected. Foley was removed.  Sponge, lap, needle, initially counts were correct x2. Patient tolerated the procedure very well. Legs were lowered to the supine position.  She was awakened from anesthesia, extubated and taken to recovery in stable condition.   COUNTS:  YES  PLAN OF CARE: Transfer to PACU

## 2019-11-25 NOTE — Transfer of Care (Signed)
Immediate Anesthesia Transfer of Care Note  Patient: Jill Craig  Procedure(s) Performed: LAPAROSCOPIC BILATERAL SALPINGECTOMY (Bilateral Abdomen) LAPAROSCOPIC LEFT OOPHORECTOMY (Left Abdomen)  Patient Location: PACU  Anesthesia Type:General  Level of Consciousness: awake, alert  and oriented  Airway & Oxygen Therapy: Patient Spontanous Breathing and Patient connected to nasal cannula oxygen  Post-op Assessment: Report given to RN and Post -op Vital signs reviewed and stable  Post vital signs: Reviewed and stable  Last Vitals:  Vitals Value Taken Time  BP 135/79 11/25/19 1141  Temp 36.4 C 11/25/19 1142  Pulse 66 11/25/19 1144  Resp 8 11/25/19 1143  SpO2 100 % 11/25/19 1144  Vitals shown include unvalidated device data.  Last Pain:  Vitals:   11/25/19 0951  TempSrc: Oral  PainSc: 0-No pain      Patients Stated Pain Goal: 5 (55/21/74 7159)  Complications: No complications documented.

## 2019-11-25 NOTE — H&P (Signed)
Jill Craig is an 54 y.o. female G100 Married female here for laparoscopic LSO and right salpingectomy due to 4cm simple persistent ovarian cyst.  This has been present on ultrasound for about 10 months. C a-125 has been normal.  Pt is aware this is very likely benign and it is ok to monitor, she desired removal.  Discussed removal of opposite fallopian tube as well.  Risks, benefits, and alternatives of continues monitoring have been discussed.  Pt desires surgical removal.    Pertinent Gynecological History: Menses: post-menopausal Bleeding: none Contraception: post menopausal status DES exposure: denies Blood transfusions: none Sexually transmitted diseases: no past history Previous GYN Procedures: none  Last mammogram: normal Date: 07/2018 Last pap: normal Date: 08/27/2019 OB History: G0, P0   Menstrual History: [name] last menstrual period was 06/01/2017 (approximate).    Past Medical History:  Diagnosis Date  . Cyst of left ovary   . History of kidney stones   . HSV infection 2004   R Buttock    Past Surgical History:  Procedure Laterality Date  . NO PAST SURGERIES      Family History  Problem Relation Age of Onset  . Prostate cancer Father   . Breast cancer Sister 61       Genetics negative    Social History:  reports that she quit smoking about 4 years ago. Her smoking use included cigarettes. She quit after 10.00 years of use. She has never used smokeless tobacco. She reports previous alcohol use. She reports that she does not use drugs.  Allergies: No Known Allergies  Medications Prior to Admission  Medication Sig Dispense Refill Last Dose  . Multiple Vitamin (MULTIVITAMIN) tablet Take 1 tablet by mouth daily.   Past Week at Unknown time    Review of Systems  All other systems reviewed and are negative.   Blood pressure 92/61, pulse (!) 48, temperature 99.3 F (37.4 C), temperature source Oral, resp. rate 14, height 5\' 2"  (1.575 m), weight 62.2 kg, last  menstrual period 06/01/2017, SpO2 98 %. Physical Exam Vitals reviewed.  Constitutional:      Appearance: Normal appearance.  Cardiovascular:     Rate and Rhythm: Normal rate and regular rhythm.  Pulmonary:     Effort: Pulmonary effort is normal.     Breath sounds: Normal breath sounds.  Neurological:     General: No focal deficit present.     Mental Status: She is alert.  Psychiatric:        Mood and Affect: Mood normal.        Behavior: Behavior normal.     No results found for this or any previous visit (from the past 24 hour(s)).  No results found.  Assessment/Plan: 54 you G1 Married female here for laparoscopic LSO and right salpingectomy due to left simple ovarian cyst that has been present in this PMP female for about 10 months.  Questions answered.  Pt ready to proceed.   [name] 11/25/2019, 10:01 AM

## 2019-11-25 NOTE — Anesthesia Preprocedure Evaluation (Addendum)
Anesthesia Evaluation  Patient identified by MRN, date of birth, ID band Patient awake    Reviewed: Allergy & Precautions, NPO status , [name] Chart, lab work & pertinent test results  History of Anesthesia Complications Negative for: history of anesthetic complications  Airway Mallampati: II  TM Distance: >3 FB Neck ROM: Full    Dental  (+) Dental Advisory Given, Teeth Intact   Pulmonary neg shortness of breath, neg COPD, neg recent URI, former smoker,    breath sounds clear to auscultation       Cardiovascular (-) hypertension(-) angina(-) Past MI, (-) CHF and (-) DOE negative cardio ROS  (-) dysrhythmias  Rhythm:Regular  - Left ventricle: The cavity size was normal. Systolic function was  normal. The estimated ejection fraction was in the range of 55%  to 60%. Wall motion was normal; there were no regional wall  motion abnormalities. Left ventricular diastolic function  parameters were normal.    Neuro/Psych negative neurological ROS  negative psych ROS   GI/Hepatic negative GI ROS, Neg liver ROS,   Endo/Other    Renal/GU Renal diseaseLab Results      Component                Value               Date                      CREATININE               0.76                09/07/2018                Musculoskeletal negative musculoskeletal ROS (+)   Abdominal   Peds  Hematology negative hematology ROS (+) Lab Results      Component                Value               Date                      WBC                      5.1                 08/28/2019                HGB                      12.1                08/28/2019                HCT                      35.9                08/28/2019                MCV                      92                  08/28/2019                PLT  176                 08/28/2019              Anesthesia Other Findings Neg stress test in 2018   Reproductive/Obstetrics                            Anesthesia Physical Anesthesia Plan  ASA: I  Anesthesia Plan: General   Post-op Pain Management:    Induction: Intravenous  PONV Risk Score and Plan: 3 and Ondansetron, Dexamethasone, Scopolamine patch - Pre-op and Midazolam  Airway Management Planned: Oral ETT  Additional Equipment: None  Intra-op Plan:   Post-operative Plan: Extubation in OR  Informed Consent: I have reviewed the patients History and Physical, chart, labs and discussed the procedure including the risks, benefits and alternatives for the proposed anesthesia with the patient or authorized representative who has indicated his/her understanding and acceptance.     Dental advisory given  Plan Discussed with: CRNA and Surgeon  Anesthesia Plan Comments:         Anesthesia Quick Evaluation

## 2019-11-25 NOTE — Anesthesia Procedure Notes (Signed)
Procedure Name: Intubation Date/Time: 11/25/2019 10:39 AM Performed by: Mekenzie Modeste D, CRNA Pre-anesthesia Checklist: Patient identified, Emergency Drugs available, Suction available and Patient being monitored Patient Re-evaluated:Patient Re-evaluated prior to induction Oxygen Delivery Method: Circle system utilized Preoxygenation: Pre-oxygenation with 100% oxygen Induction Type: IV induction Ventilation: Mask ventilation without difficulty Laryngoscope Size: Mac and 3 Grade View: Grade I Tube type: Oral Tube size: 7.0 mm Number of attempts: 1 Airway Equipment and Method: Stylet and Oral airway Placement Confirmation: ETT inserted through vocal cords under direct vision,  positive ETCO2 and breath sounds checked- equal and bilateral Secured at: 20 cm Tube secured with: Tape Dental Injury: Teeth and Oropharynx as per pre-operative assessment

## 2019-11-26 ENCOUNTER — Encounter (HOSPITAL_BASED_OUTPATIENT_CLINIC_OR_DEPARTMENT_OTHER): Payer: Self-pay | Admitting: Obstetrics & Gynecology

## 2019-11-26 LAB — CYTOLOGY - NON PAP

## 2019-11-26 LAB — SURGICAL PATHOLOGY

## 2019-11-28 NOTE — Anesthesia Postprocedure Evaluation (Signed)
Anesthesia Post Note  Patient: Jill Craig  Procedure(s) Performed: LAPAROSCOPIC BILATERAL SALPINGECTOMY (Bilateral Abdomen) LAPAROSCOPIC LEFT OOPHORECTOMY (Left Abdomen)     Patient location during evaluation: PACU Anesthesia Type: General Level of consciousness: awake and alert Pain management: pain level controlled Vital Signs Assessment: post-procedure vital signs reviewed and stable Respiratory status: spontaneous breathing, nonlabored ventilation, respiratory function stable and patient connected to nasal cannula oxygen Cardiovascular status: blood pressure returned to baseline and stable Postop Assessment: no apparent nausea or vomiting Anesthetic complications: no   No complications documented.  Last Vitals:  Vitals:   11/25/19 1245 11/25/19 1336  BP: 126/79 140/78  Pulse: 63 68  Resp: 10 16  Temp:  36.8 C  SpO2: 98% 100%    Last Pain:  Vitals:   11/26/19 1028  TempSrc:   PainSc: 2                  [name]

## 2019-12-02 ENCOUNTER — Telehealth: Payer: Self-pay | Admitting: Obstetrics & Gynecology

## 2019-12-02 NOTE — Telephone Encounter (Signed)
Spoke back with pt after reviewing call with Dr Sabra Heck. Pt advised can have OV today as work-in if feels concerns or can continue pain regimen until 2 week post op appt scheduled for 12/11/19. Pt thankful for update. Pt states will wait til post op appt. Pt advised to return call to office if any other concerns or worsening sx. Pt agreeable and verbalized understanding.  Encounter closed.

## 2019-12-02 NOTE — Telephone Encounter (Signed)
1 week post op- Bilateral Salpingectomy with left oophorectomy 11/25/19  Spoke with pt. Pt states having shooting pain/"lingering pain"  on LLQ when moving. Pt states pain is a 5 without pain meds and is 2-3 with taking Rx Ibuprofen. States has been taking Rx Ibuprofen daily every 8 hours as directed. Pt states able to lie down, sit, walk for short distances without pain. Pt only took Rx Norvasc for first 2-4 days post op. Denies any incision drainage, fever, chills, cramping or vaginal bleeding. Declines taking more Norvasc and only will take Ibuprofen.  States same pain as when had surgery, has not gotten worse. States cannot cough or laugh without holding in on left side where ovary and cyst were removed.   Pt states started having normal BM every day on Sunday , but still having same abd bloating as before. Denies NVD. Pt states has used ice therapy and has helped.  Pt states has started back work today. Denies any heavy lifting.   Advised will review with Dr Sabra Heck and return call. Pt agreeable.   Routing to Dr Sabra Heck.

## 2019-12-02 NOTE — Telephone Encounter (Signed)
Patient is "having a lingering pain" since having surgery lat week. She would like to discuss this with Dr.Miller's nurse.

## 2019-12-09 NOTE — Progress Notes (Signed)
Post Operative Visit  Procedure: laparoscopic bilateral salpingectomy, laparoscopic left oophorectomy Days Post-op: 2 week  Subjective: She is two weeks post op from laparoscopic LSO and right salpingectomy.  She had more pain than she expected but hasn't taken anything since last Friday.  Bowel movements are not back to normal.  She is having some bloating.  She started taking Colace and benefiber and this has helped.  Denies vaginal bleeding.  Urinary function is normal.  Pictures and pathology shown to pt.  Objective: BP 110/68   Pulse 68   Resp 16   Wt 137 lb (62.1 kg)   LMP 06/01/2017 (Approximate)   BMI 25.06 kg/m   EXAM General: alert and no distress Resp: clear to auscultation bilaterally Cardio: regular rate and rhythm, S1, S2 normal, no murmur, click, rub or gallop GI: soft, non-tender; bowel sounds normal; no masses,  no organomegaly and incision: clean, dry and intact Extremities: extremities normal, atraumatic, no cyanosis or edema Vaginal Bleeding: none  Assessment: s/p LSO, right salpingectomy  Plan: Doing well.  Post op recovery course reviewed.  Will plan to see pt for routine exam 1 year or sooner with new issues/concerns.

## 2019-12-11 ENCOUNTER — Ambulatory Visit (INDEPENDENT_AMBULATORY_CARE_PROVIDER_SITE_OTHER): Payer: BC Managed Care – PPO | Admitting: Obstetrics & Gynecology

## 2019-12-11 ENCOUNTER — Encounter: Payer: Self-pay | Admitting: Obstetrics & Gynecology

## 2019-12-11 ENCOUNTER — Other Ambulatory Visit: Payer: Self-pay

## 2019-12-11 VITALS — BP 110/68 | HR 68 | Resp 16 | Wt 137.0 lb

## 2019-12-11 DIAGNOSIS — Z9889 Other specified postprocedural states: Secondary | ICD-10-CM

## 2020-01-23 ENCOUNTER — Telehealth: Payer: Self-pay

## 2020-01-23 NOTE — Telephone Encounter (Signed)
Spoke with patient. Patient is requesting to come in for hormones to be checked and discuss tx options. Reports mood changes, difficulty focusing, trouble sleeping, eating habits have changes and hot flashes over the past year. S/p laparoscopic LSO and right salpingectomy 11/25/19.   OV scheduled for 10/26 at 2:30pm w/ Dr. Sabra Heck. Patient verbalizes understanding and is agreeable.   Last AEX 08/28/19  Routing to provider for final review. Patient is agreeable to disposition. Will close encounter.

## 2020-01-23 NOTE — Telephone Encounter (Signed)
Patient is calling in regards to wanting a hormone check.

## 2020-01-26 NOTE — Progress Notes (Signed)
GYNECOLOGY  VISIT  CC:   Menopausal symptoms  HPI: 54 y.o. G12P0000 Married  female here for menopausal symptoms that has worsened since surgery to remove ovary that had serous cystadenoma on it.  She reports she is now having hot flashes, night sweats that wake her up at night.  Reports she is waking up several times a night.  She's having some sensation of skin crawling.  She's alsohaving some brain fog.  She feels like she is making some mistakes at work.  Lack of sleep isn't helping this as well.  She feels her mood is good but her patience feels short.  Would like to consider options.  Open to HRT use.    GYNECOLOGIC HISTORY: [name] last menstrual period was 06/01/2017 (approximate). Contraception: post menopausal Menopausal hormone therapy: none  Patient Active Problem List   Diagnosis Date Noted   Nephrolithiasis 08/28/2019   Family history of breast cancer 08/28/2019   Fibrocystic breast disease 06/06/2013    Past Medical History:  Diagnosis Date   Cyst of left ovary    History of kidney stones    HSV infection 2004   R Buttock    Past Surgical History:  Procedure Laterality Date   LAPAROSCOPIC BILATERAL SALPINGECTOMY Bilateral 11/25/2019   Procedure: LAPAROSCOPIC BILATERAL SALPINGECTOMY;  Surgeon: [name], MD;  Location: [facility];  Service: Gynecology;  Laterality: Bilateral;  to follow first case   NO PAST SURGERIES      MEDS:   Current Outpatient Medications on File Prior to Visit  Medication Sig Dispense Refill   Multiple Vitamin (MULTIVITAMIN) tablet Take 1 tablet by mouth daily.     No current facility-administered medications on file prior to visit.    ALLERGIES: Patient has no known allergies.  Family History  Problem Relation Age of Onset   Prostate cancer Father    Breast cancer Sister 77       Genetics negative    SH:  Married, non smoker  Review of Systems  Constitutional: Negative.        Head fog, no  energy, trouble sleeping, forgetful  HENT: Negative.   Eyes: Negative.   Respiratory: Negative.   Cardiovascular: Negative.   Gastrointestinal: Negative.   Endocrine: Negative.   Genitourinary: Negative.   Musculoskeletal: Negative.   Skin: Negative.   Allergic/Immunologic: Negative.   Neurological: Negative.   Hematological: Negative.   Psychiatric/Behavioral: Negative.     PHYSICAL EXAMINATION:    Wt 140 lb (63.5 kg)    LMP 06/01/2017 (Approximate)    BMI 25.61 kg/m     General appearance: alert, cooperative and appears stated age No physical exam performed today.  No chaperone was needed  Assessment: Vasomotor symptoms  Plan: We discussed options including HRT, non hormonal therapies and she is interested in HRT.  Risks and benefits reviewed.  Will start with climara 0.05mg  patch weekly.  #4/1RF and Prometrium 200mg  days 1-15 each month.  Advised when bleeding should occur. Estradiol and total testosterone level obtained (pt feels she's had this done previously and would like to compare levels but must find prior lab work first.  Will get it to me if can find it.)   26 minutes of total time was spent for this patient encounter, including preparation, face-to-face counseling with the patient and coordination of care, and documentation of the encounter.

## 2020-01-27 ENCOUNTER — Ambulatory Visit (INDEPENDENT_AMBULATORY_CARE_PROVIDER_SITE_OTHER): Payer: BC Managed Care – PPO | Admitting: Obstetrics & Gynecology

## 2020-01-27 ENCOUNTER — Encounter: Payer: Self-pay | Admitting: Obstetrics & Gynecology

## 2020-01-27 ENCOUNTER — Other Ambulatory Visit: Payer: Self-pay

## 2020-01-27 VITALS — Wt 140.0 lb

## 2020-01-27 DIAGNOSIS — N951 Menopausal and female climacteric states: Secondary | ICD-10-CM

## 2020-01-27 MED ORDER — ESTRADIOL 0.05 MG/24HR TD PTWK: 0.0500 mg | MEDICATED_PATCH | TRANSDERMAL | 1 refills | Status: DC

## 2020-01-27 MED ORDER — PROGESTERONE 200 MG PO CAPS: ORAL_CAPSULE | ORAL | 0 refills | Status: DC

## 2020-01-30 ENCOUNTER — Encounter: Payer: Self-pay | Admitting: Obstetrics & Gynecology

## 2020-01-30 LAB — ESTRADIOL: Estradiol: 12.4 pg/mL

## 2020-01-30 LAB — TESTOSTERONE, TOTAL, LC/MS/MS: Testosterone, total: 14.1 ng/dL

## 2020-02-03 ENCOUNTER — Telehealth: Payer: Self-pay

## 2020-02-03 NOTE — Telephone Encounter (Signed)
Ok to switch to estradiol 0.5mg  daily.  Ok to send in 1 month or 3 month supply, depending on what she wants.  Thanks.

## 2020-02-03 NOTE — Telephone Encounter (Signed)
Spoke with pt. Pt given results and recommendations per Dr Sabra Heck. Pt states has used estrogen patch and does not like due to falling off. Has tried multiple ways to help patch stick better, but now asking for oral estrogen pill if that would be ok to use per Dr Sabra Heck.  Advised will review with Dr Sabra Heck and return call with update. Pt agreeable.   Routing to Dr Sabra Heck.

## 2020-02-03 NOTE — Telephone Encounter (Signed)
Left message for pt to return call to triage RN. 

## 2020-02-03 NOTE — Telephone Encounter (Signed)
-----   Message from [name], MD sent at 01/30/2020 10:50 PM EDT ----- Please let pt know her estradiol level and total testosterone levels are low.  She was started on HRT.  I do not want to add testosterone at this time but this is something we can do in the future if she desires.  I want to see what symptoms improved with the climara patch and prometrium first.

## 2020-02-04 MED ORDER — ESTRADIOL 0.5 MG PO TABS: 0.5000 mg | ORAL_TABLET | Freq: Every day | ORAL | 1 refills | Status: DC

## 2020-02-04 NOTE — Telephone Encounter (Signed)
Spoke with pt. Pt given update per Dr Sabra Heck. Pt agreeable to switch to estradiol 0.5 mg daily. Rx sent to pharmacy on file # 90, 1 RF.  Pt states is going to transfer to [facility]. Pt will have follow up on HRT in Feb or March with Dr Sabra Heck. Pt thankful for Rx switch.  Routing to Dr Sabra Heck for update  Encounter closed

## 2020-04-28 ENCOUNTER — Telehealth (HOSPITAL_BASED_OUTPATIENT_CLINIC_OR_DEPARTMENT_OTHER): Payer: Self-pay | Admitting: Obstetrics & Gynecology

## 2020-04-30 ENCOUNTER — Other Ambulatory Visit: Payer: Self-pay

## 2020-05-01 MED ORDER — PROGESTERONE 200 MG PO CAPS
ORAL_CAPSULE | ORAL | 1 refills | Status: DC
Start: 2020-05-01 — End: 2020-08-02

## 2020-05-01 MED ORDER — ESTRADIOL 0.5 MG PO TABS
0.5000 mg | ORAL_TABLET | Freq: Every day | ORAL | 1 refills | Status: DC
Start: 2020-05-01 — End: 2020-08-02

## 2020-05-01 NOTE — Telephone Encounter (Signed)
Rx for HRT completed electronically.

## 2020-05-01 NOTE — Telephone Encounter (Signed)
HRT refill completed.  Pt will need AEX after 09/01/2020.  Thanks.

## 2020-07-27 ENCOUNTER — Telehealth (HOSPITAL_BASED_OUTPATIENT_CLINIC_OR_DEPARTMENT_OTHER): Payer: Self-pay | Admitting: *Deleted

## 2020-07-27 NOTE — Telephone Encounter (Signed)
Pt called requesting refills on progesterone, estradiol, and valtrex. Advised pt that she would need to schedule an appointment for an annual exam. Appointment given. Advised that I would send her request for refill to provider and she could check with her pharmacy in the next 24 hours.

## 2020-08-02 ENCOUNTER — Other Ambulatory Visit (HOSPITAL_BASED_OUTPATIENT_CLINIC_OR_DEPARTMENT_OTHER): Payer: Self-pay | Admitting: Obstetrics & Gynecology

## 2020-08-02 MED ORDER — PROGESTERONE 200 MG PO CAPS
ORAL_CAPSULE | ORAL | 1 refills | Status: DC
Start: 2020-08-02 — End: 2020-10-25

## 2020-08-02 MED ORDER — ESTRADIOL 0.5 MG PO TABS
0.5000 mg | ORAL_TABLET | Freq: Every day | ORAL | 1 refills | Status: DC
Start: 2020-08-02 — End: 2020-10-25

## 2020-08-02 MED ORDER — VALACYCLOVIR HCL 500 MG PO TABS
ORAL_TABLET | ORAL | 1 refills | Status: DC
Start: 2020-08-02 — End: 2020-10-25

## 2020-08-31 ENCOUNTER — Ambulatory Visit (HOSPITAL_BASED_OUTPATIENT_CLINIC_OR_DEPARTMENT_OTHER): Payer: BC Managed Care – PPO | Admitting: Obstetrics & Gynecology

## 2020-10-14 ENCOUNTER — Encounter (HOSPITAL_BASED_OUTPATIENT_CLINIC_OR_DEPARTMENT_OTHER): Payer: Self-pay | Admitting: *Deleted

## 2020-10-25 ENCOUNTER — Other Ambulatory Visit: Payer: Self-pay

## 2020-10-25 ENCOUNTER — Ambulatory Visit (INDEPENDENT_AMBULATORY_CARE_PROVIDER_SITE_OTHER): Payer: BC Managed Care – PPO | Admitting: Obstetrics & Gynecology

## 2020-10-25 ENCOUNTER — Ambulatory Visit (HOSPITAL_BASED_OUTPATIENT_CLINIC_OR_DEPARTMENT_OTHER)
Admission: RE | Admit: 2020-10-25 | Discharge: 2020-10-25 | Disposition: A | Discharge status: Home / Self Care | Payer: BC Managed Care – PPO | Source: Ambulatory Visit | Attending: Obstetrics & Gynecology | Admitting: Obstetrics & Gynecology

## 2020-10-25 ENCOUNTER — Encounter (HOSPITAL_BASED_OUTPATIENT_CLINIC_OR_DEPARTMENT_OTHER): Payer: Self-pay | Admitting: Obstetrics & Gynecology

## 2020-10-25 VITALS — BP 98/59 | HR 45 | Ht 63.5 in | Wt 142.0 lb

## 2020-10-25 DIAGNOSIS — Z9079 Acquired absence of other genital organ(s): Secondary | ICD-10-CM

## 2020-10-25 DIAGNOSIS — Z803 Family history of malignant neoplasm of breast: Secondary | ICD-10-CM

## 2020-10-25 DIAGNOSIS — B009 Herpesviral infection, unspecified: Secondary | ICD-10-CM

## 2020-10-25 DIAGNOSIS — Z1231 Encounter for screening mammogram for malignant neoplasm of breast: Secondary | ICD-10-CM | POA: Insufficient documentation

## 2020-10-25 DIAGNOSIS — Z01419 Encounter for gynecological examination (general) (routine) without abnormal findings: Secondary | ICD-10-CM | POA: Diagnosis not present

## 2020-10-25 DIAGNOSIS — Z79890 Hormone replacement therapy (postmenopausal): Secondary | ICD-10-CM

## 2020-10-25 DIAGNOSIS — Z1211 Encounter for screening for malignant neoplasm of colon: Secondary | ICD-10-CM

## 2020-10-25 DIAGNOSIS — Z90722 Acquired absence of ovaries, bilateral: Secondary | ICD-10-CM

## 2020-10-25 DIAGNOSIS — Z780 Asymptomatic menopausal state: Secondary | ICD-10-CM | POA: Diagnosis not present

## 2020-10-25 MED ORDER — ACYCLOVIR 5 % EX OINT: 1.0000 "application " | TOPICAL_OINTMENT | CUTANEOUS | 1 refills | Status: DC

## 2020-10-25 MED ORDER — ESTRADIOL 0.5 MG PO TABS: 0.5000 mg | ORAL_TABLET | Freq: Two times a day (BID) | ORAL | 3 refills | Status: DC

## 2020-10-25 MED ORDER — VALACYCLOVIR HCL 500 MG PO TABS: ORAL_TABLET | ORAL | 2 refills | Status: DC

## 2020-10-25 MED ORDER — PROGESTERONE 200 MG PO CAPS: ORAL_CAPSULE | ORAL | 3 refills | Status: DC

## 2020-10-25 NOTE — Progress Notes (Signed)
[name] Married female here for annual exam.  Continues to have hot flashes and some sleep issues.  Denies vaginal bleeding.  She reports she hasn't been on the progesterone for several months as the pharmacy did not have RF.  This was done 08/2020 and sent electronically and received by the pharmacy.  Shown to pt in EMR and she sees it was completed in May.  Aware rx will be done today and to let me know if there are any issues with this.  She also needs refill for valtrex.  Continues to be frustrated with weight.    [name] last menstrual period was 06/01/2017 (approximate).          Sexually active: Yes.    The current method of family planning is post menopausal status.    Exercising: Yes.    Smoker:  no  Health Maintenance: Pap:  08/28/2019 Negative History of abnormal Pap:  no MMG:  06/06/2018 Diagnostic Colonoscopy:  cologuard 2018 BMD:   not done yet, plan closer to age 83 TDaP:  2018 Shingrix:   discussed Screening Labs: 08/2019   reports that she quit smoking about 5 years ago. Her smoking use included cigarettes. She has never used smokeless tobacco. She reports previous alcohol use. She reports that she does not use drugs.  Past Medical History:  Diagnosis Date   History of kidney stones    HSV infection 2004   R Buttock    Past Surgical History:  Procedure Laterality Date   LAPAROSCOPIC BILATERAL SALPINGECTOMY Bilateral 11/25/2019   Procedure: LAPAROSCOPIC BILATERAL SALPINGECTOMY;  Surgeon: [name], MD;  Location: [facility];  Service: Gynecology;  Laterality: Bilateral;  to follow first case    Current Outpatient Medications  Medication Sig Dispense Refill   acyclovir ointment (ZOVIRAX) 5 % Apply 1 application topically every 3 (three) hours. 5 g 1   Multiple Vitamin (MULTIVITAMIN) tablet Take 1 tablet by mouth daily.     estradiol (ESTRACE) 0.5 MG tablet Take 1 tablet (0.5 mg total) by mouth 2 (two) times daily. 180 tablet 3    progesterone (PROMETRIUM) 200 MG capsule Take 1 capsule days 1-15 each month 45 capsule 3   valACYclovir (VALTREX) 500 MG tablet 1 tab bid x 3 days for new symptoms. 30 tablet 2   No current facility-administered medications for this visit.    Family History  Problem Relation Age of Onset   Prostate cancer Father    Breast cancer Sister 57       Genetics negative    Review of Systems  All other systems reviewed and are negative.  Exam:   BP (!) 98/59 (BP Location: Right Arm, Patient Position: Sitting, Cuff Size: Small)   Pulse (!) 45   Ht 5' 3.5" (1.613 m)   Wt 142 lb (64.4 kg)   LMP 06/01/2017 (Approximate)   BMI 24.76 kg/m   Height: 5' 3.5" (161.3 cm)  General appearance: alert, cooperative and appears stated age Head: Normocephalic, without obvious abnormality, atraumatic Neck: no adenopathy, supple, symmetrical, trachea midline and thyroid normal to inspection and palpation Lungs: clear to auscultation bilaterally Breasts: normal appearance, no masses or tenderness Heart: regular rate and rhythm Abdomen: soft, non-tender; bowel sounds normal; no masses,  no organomegaly Extremities: extremities normal, atraumatic, no cyanosis or edema Skin: Skin color, texture, turgor normal. No rashes or lesions Lymph nodes: Cervical, supraclavicular, and axillary nodes normal. No abnormal inguinal nodes palpated Neurologic: Grossly normal   Pelvic: External genitalia:  no lesions              Urethra:  normal appearing urethra with no masses, tenderness or lesions              Bartholins and Skenes: normal                 Vagina: normal appearing vagina with normal color and no discharge, no lesions              Cervix: no lesions              Pap taken: No. Bimanual Exam:  Uterus:  normal size, contour, position, consistency, mobility, non-tender              Adnexa: normal adnexa and no mass, fullness, tenderness               Rectovaginal: Confirms               Anus:  normal  sphincter tone, no lesions  Chaperone, Octaviano Batty, CMA, was present for exam.  Assessment/Plan: 1. Well woman exam with routine gynecological exam - pap neg with neg HR HPV 08/28/2019 - MMG 06/2018.  Overdue. - Cologuard negative  - Plan BMD closer to age 80 - Lab work done 08/2019.  Consider repeating next year - Vaccines reviewed  2. Postmenopausal  3. Hormone replacement therapy - importance of taking progesterone every month discussed.  Advised to start now and if has any bleeding, needs to call.  Has only been on unopposed estradiol 0.'5mg'$  for a few months so may not have any bleeding.  Will need endometrial biopsy if does have bleeding. - yearly MMGs will be needed to be on HRT - progesterone (PROMETRIUM) 200 MG capsule; Take 1 capsule days 1-15 each month  Dispense: 45 capsule; Refill: 3 - estradiol (ESTRACE) 0.5 MG tablet; Take 1 tablet (0.5 mg total) by mouth 2 (two) times daily.  Dispense: 180 tablet; Refill: 3  4. Family history of breast cancer in sister - sister had negative genetic testing.  Pt does not want genetic testing nor breast MRI  5. Colon cancer screening - Cologuard ordered  6. History of bilateral salpingo-oophorectomy (BSO) - 11/2019 with pathology showing benign cystadenoma  7. HSV (herpes simplex virus) infection - valACYclovir (VALTREX) 500 MG tablet; 1 tab bid x 3 days for new symptoms.  Dispense: 30 tablet; Refill: 2 - acyclovir ointment (ZOVIRAX) 5 %; Apply 1 application topically every 3 (three) hours.  Dispense: 5 g; Refill: 1

## 2020-10-27 ENCOUNTER — Other Ambulatory Visit: Payer: Self-pay | Admitting: Obstetrics & Gynecology

## 2020-10-27 DIAGNOSIS — Z79890 Hormone replacement therapy (postmenopausal): Secondary | ICD-10-CM | POA: Insufficient documentation

## 2020-10-27 DIAGNOSIS — Z780 Asymptomatic menopausal state: Secondary | ICD-10-CM | POA: Insufficient documentation

## 2020-10-27 DIAGNOSIS — B009 Herpesviral infection, unspecified: Secondary | ICD-10-CM | POA: Insufficient documentation

## 2020-10-27 DIAGNOSIS — R928 Other abnormal and inconclusive findings on diagnostic imaging of breast: Secondary | ICD-10-CM

## 2020-10-27 DIAGNOSIS — Z90722 Acquired absence of ovaries, bilateral: Secondary | ICD-10-CM | POA: Insufficient documentation

## 2020-10-29 ENCOUNTER — Ambulatory Visit: Payer: BC Managed Care – PPO

## 2020-11-15 ENCOUNTER — Other Ambulatory Visit: Payer: Self-pay

## 2020-11-15 ENCOUNTER — Ambulatory Visit: Payer: BC Managed Care – PPO

## 2020-11-15 ENCOUNTER — Ambulatory Visit
Admission: RE | Admit: 2020-11-15 | Discharge: 2020-11-15 | Disposition: A | Discharge status: Home / Self Care | Payer: BC Managed Care – PPO | Source: Ambulatory Visit | Attending: Obstetrics & Gynecology | Admitting: Obstetrics & Gynecology

## 2020-11-15 DIAGNOSIS — R928 Other abnormal and inconclusive findings on diagnostic imaging of breast: Secondary | ICD-10-CM

## 2020-11-25 IMAGING — CT CT ABDOMEN AND PELVIS WITH CONTRAST
2 of 5 series · 16 of 46 positions shown, 18 images · IV contrast (ISOVUE)
Comparison: None.

CLINICAL DATA: Patient with abdominal pain and nausea.

EXAM:
CT ABDOMEN AND PELVIS WITH CONTRAST
TECHNIQUE: Multidetector CT imaging of the abdomen and pelvis was performed
using the standard protocol following bolus administration of
intravenous contrast.
CONTRAST:  100mL OMNIPAQUE IOHEXOL 300 MG/ML  SOLN

[Series 2: axial st · axial · 0.63mm/px · z∈[-430,-54]mm · 13 of 87 slices shown, 15 images]
[im 6/87  soft-tissue]
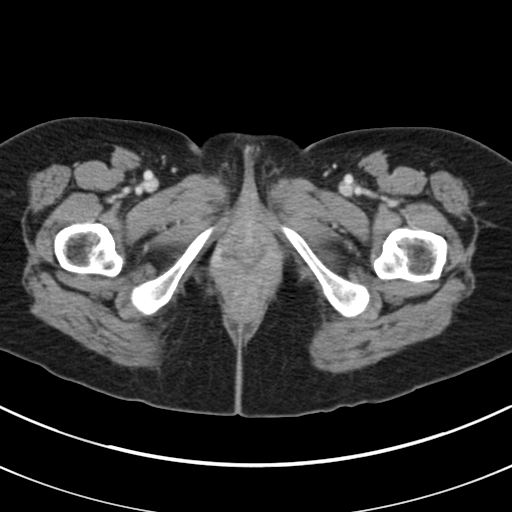
[im 6/87  bone]
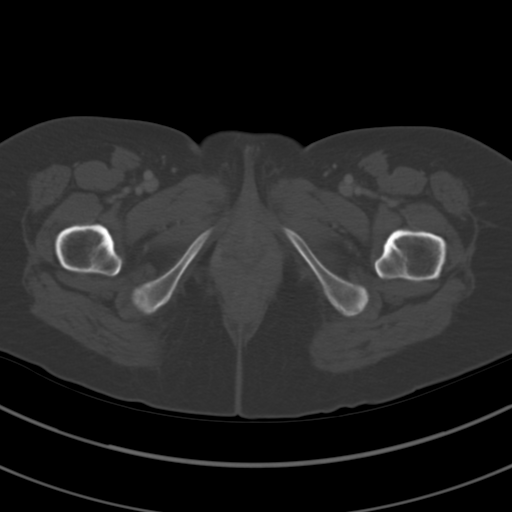
[im 11/87  soft-tissue]
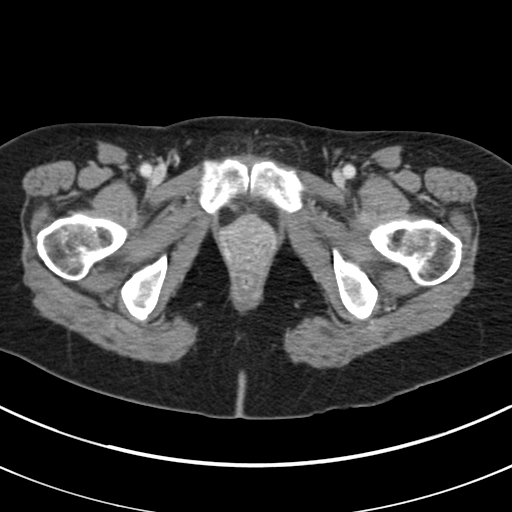
[im 17/87  soft-tissue]
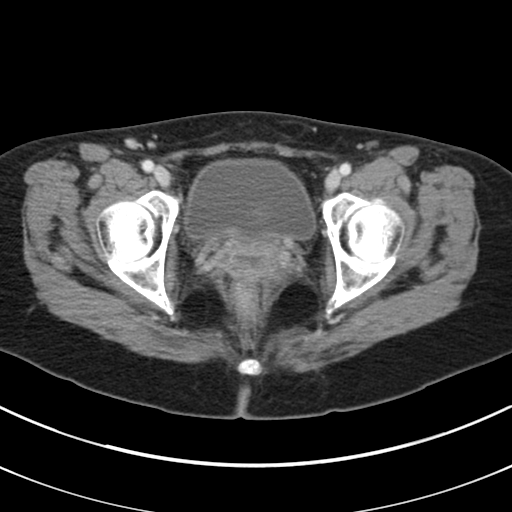
[im 27/87  soft-tissue]
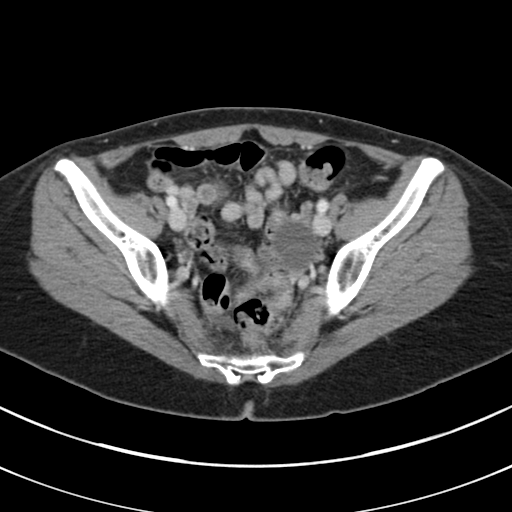
[im 33/87  soft-tissue]
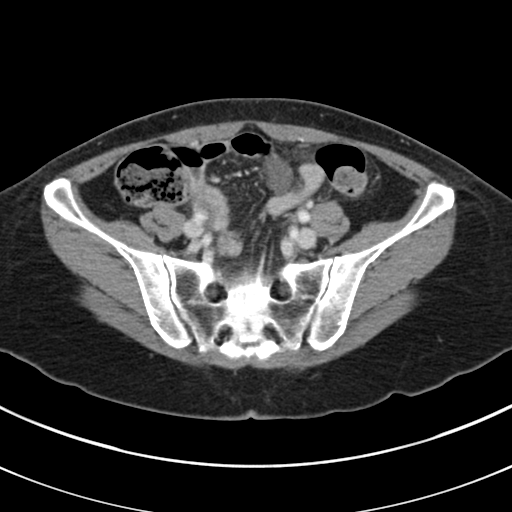
[im 38/87  soft-tissue]
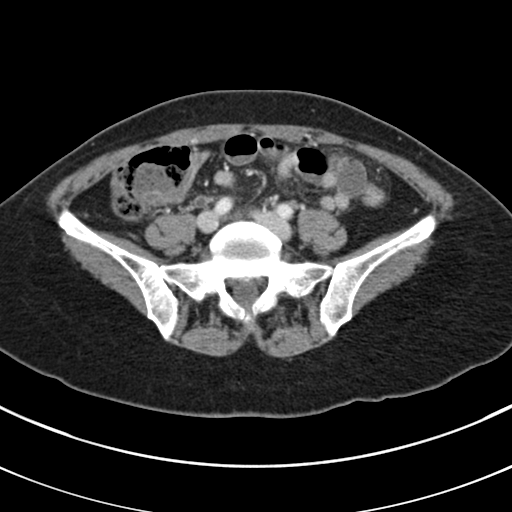
[im 44/87  soft-tissue]
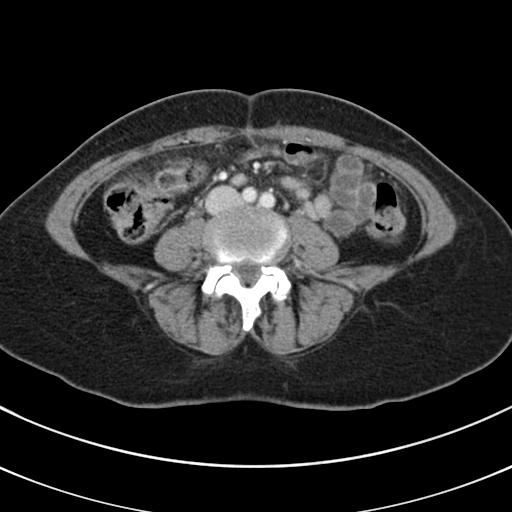
[im 49/87  soft-tissue]
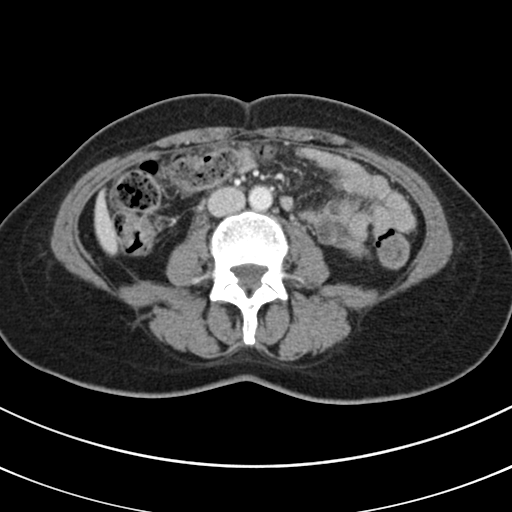
[im 54/87  soft-tissue]
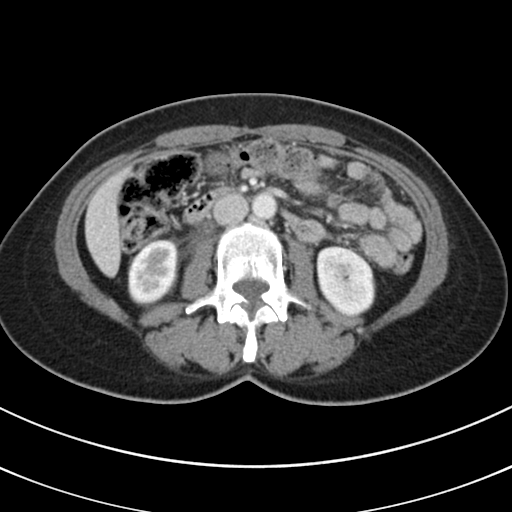
[im 54/87  bone]
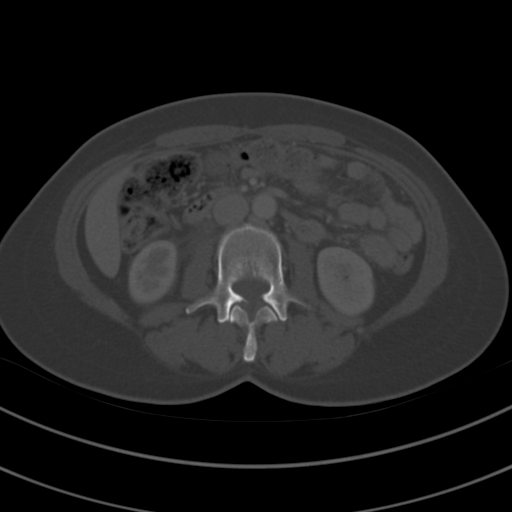
[im 60/87  soft-tissue]
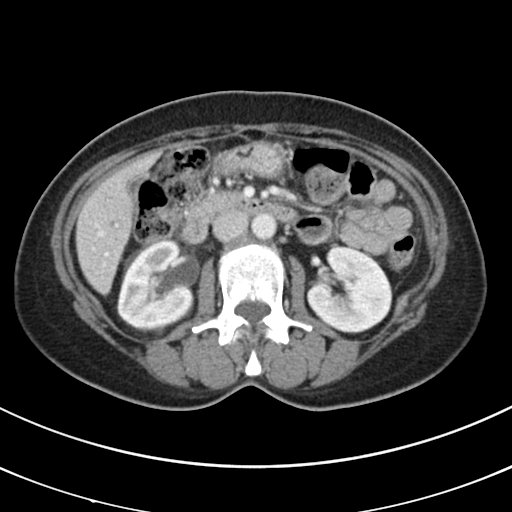
[im 70/87  soft-tissue]
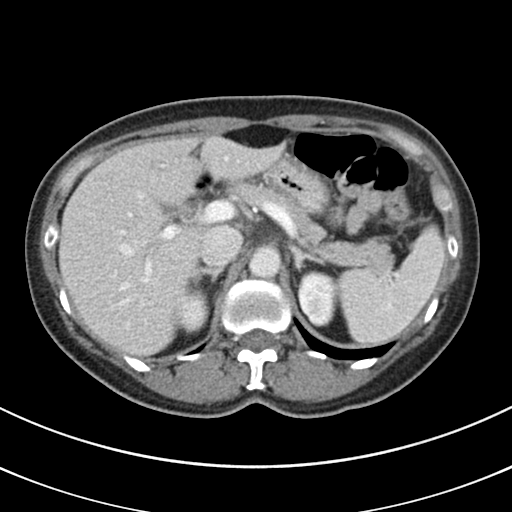
[im 76/87  soft-tissue]
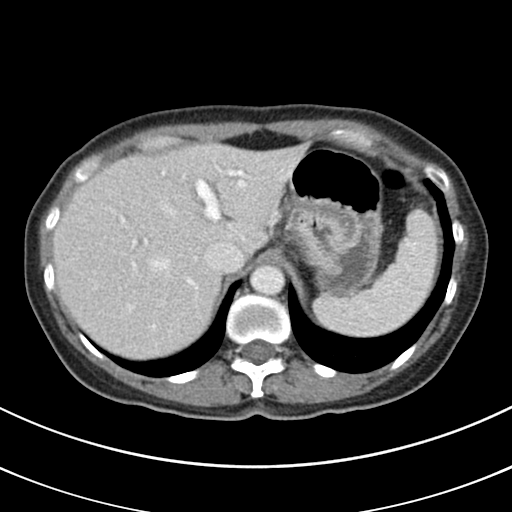
[im 81/87  soft-tissue]
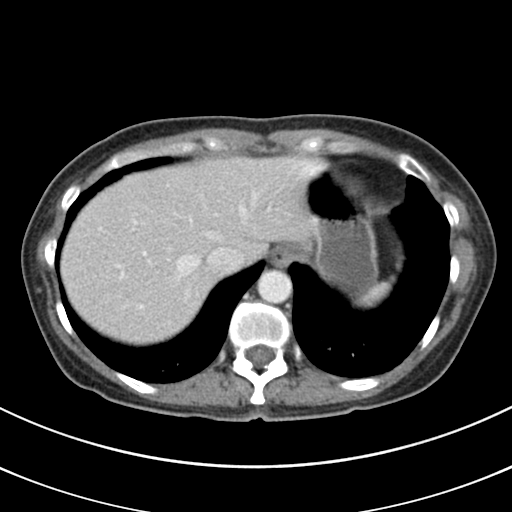

[Series 5: coronal st · coronal · 0.70mm/px · 3 of 109 slices shown]
[im 37/109  soft-tissue]
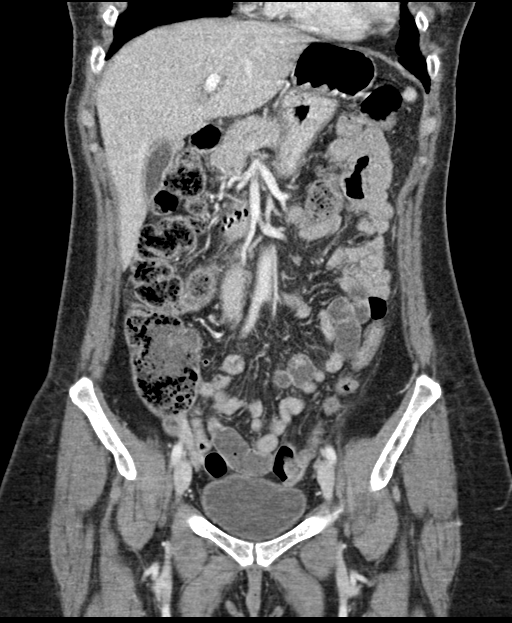
[im 49/109  soft-tissue]
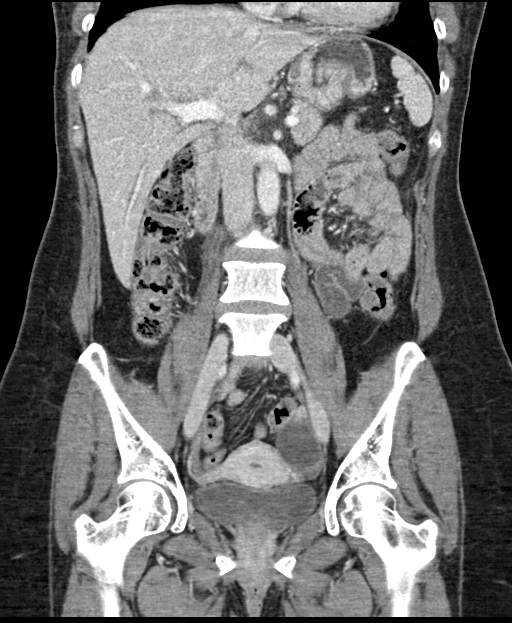
[im 61/109  soft-tissue]
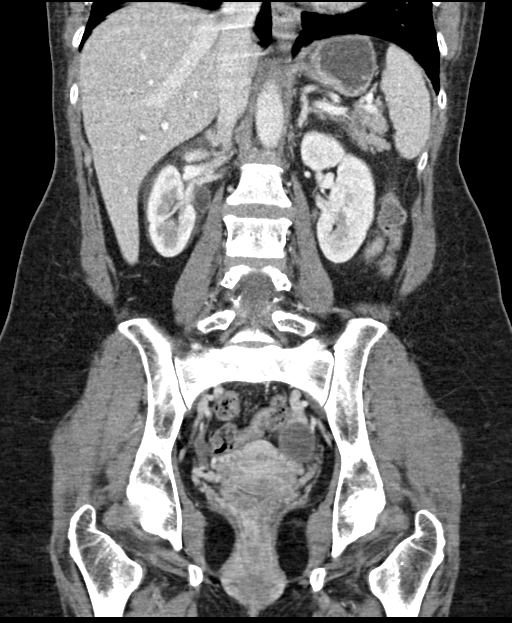

[16 of 46 positions shown; findings below may reference images not displayed]

FINDINGS: Lower chest: Normal heart size. Dependent atelectasis within the
bilateral lower lobes.

Hepatobiliary: The liver is normal in size and contour. No focal
hepatic lesion is identified. Gallbladder is unremarkable. No
intrahepatic or extrahepatic biliary ductal dilatation.

Pancreas: Unremarkable

Spleen: Unremarkable

Adrenals/Urinary Tract: Normal adrenal glands. Nonobstructing 2 mm
stone inferior pole left kidney.

There is delayed enhancement of the right kidney with mild right
hydronephrosis. Mild dilatation of the right ureter to the level of
the urinary bladder with there is an obstructing 3 mm stone (image
69; series 2). Urinary bladder is decompressed. Minimal excreted
contrast within the right renal collecting system on delayed images.

Stomach/Bowel: Normal morphology of the stomach. No evidence for
small bowel obstruction. No free fluid or free intraperitoneal air.

Vascular/Lymphatic: Normal caliber abdominal aorta. No
retroperitoneal lymphadenopathy.

Reproductive: Uterus is unremarkable. Small fibroid anterior aspect
of the uterine body. There is a 4.6 x 3.7 cm cyst within the left
adnexa.

Other: None.

Musculoskeletal: No aggressive or acute appearing osseous lesions.
IMPRESSION: 1. There is an obstructing 3 mm stone within the distal right ureter
at the level of the UVJ which results in mild right
hydroureteronephrosis and delayed enhancement of the right kidney.
2. Nonobstructing 2 mm stone inferior pole left kidney.

## 2020-12-08 DIAGNOSIS — Z1211 Encounter for screening for malignant neoplasm of colon: Secondary | ICD-10-CM | POA: Diagnosis not present

## 2020-12-13 LAB — COLOGUARD: Cologuard: NEGATIVE

## 2021-11-16 ENCOUNTER — Encounter (HOSPITAL_BASED_OUTPATIENT_CLINIC_OR_DEPARTMENT_OTHER): Payer: Self-pay | Admitting: Obstetrics & Gynecology

## 2021-11-16 ENCOUNTER — Ambulatory Visit (INDEPENDENT_AMBULATORY_CARE_PROVIDER_SITE_OTHER): Payer: BC Managed Care – PPO | Admitting: Obstetrics & Gynecology

## 2021-11-16 ENCOUNTER — Other Ambulatory Visit (HOSPITAL_BASED_OUTPATIENT_CLINIC_OR_DEPARTMENT_OTHER): Payer: Self-pay | Admitting: Obstetrics & Gynecology

## 2021-11-16 VITALS — BP 92/60 | HR 59 | Ht 61.75 in | Wt 122.8 lb

## 2021-11-16 DIAGNOSIS — B009 Herpesviral infection, unspecified: Secondary | ICD-10-CM | POA: Diagnosis not present

## 2021-11-16 DIAGNOSIS — Z803 Family history of malignant neoplasm of breast: Secondary | ICD-10-CM

## 2021-11-16 DIAGNOSIS — Z79890 Hormone replacement therapy (postmenopausal): Secondary | ICD-10-CM | POA: Diagnosis not present

## 2021-11-16 DIAGNOSIS — Z01419 Encounter for gynecological examination (general) (routine) without abnormal findings: Secondary | ICD-10-CM

## 2021-11-16 DIAGNOSIS — E8881 Metabolic syndrome: Secondary | ICD-10-CM

## 2021-11-16 MED ORDER — ANGELIQ 0.5-1 MG PO TABS: 1.0000 | ORAL_TABLET | Freq: Every day | ORAL | 3 refills | Status: DC

## 2021-11-16 MED ORDER — VALACYCLOVIR HCL 500 MG PO TABS: ORAL_TABLET | ORAL | 2 refills | Status: DC

## 2021-11-16 NOTE — Addendum Note (Signed)
Addended by: [name] on: 11/16/2021 09:38 AM   Modules accepted: Orders

## 2021-11-16 NOTE — Progress Notes (Signed)
56 y.o. G27P0000 Married female here for annual exam.  Having some travels planned this fall.  Will go to [place] for work/please trip.  Has new grand-niece in the family.  She is in [place].    Denies vaginal bleeding.    On Angelique and Glucophage prescribed by provider in [place].  Declines Shingrix vaccination.  [name] last menstrual period was 06/01/2017 (approximate).          Sexually active: No.  The current method of family planning is post menopausal status.    Exercising: Yes.    Smoker:  yes  Health Maintenance: Pap:  08/08/19 neg History of abnormal Pap:  no MMG:  10/25/20 neg Cologuard:   12/08/20 neg Screening Labs: had lab work in [place]   reports that she has been smoking cigarettes. She has never used smokeless tobacco. She reports that she does not currently use alcohol. She reports that she does not use drugs.  Past Medical History:  Diagnosis Date   History of kidney stones    HSV infection 2004   R Buttock   Insulin resistance     Past Surgical History:  Procedure Laterality Date   LAPAROSCOPIC BILATERAL SALPINGECTOMY Bilateral 11/25/2019   Procedure: LAPAROSCOPIC BILATERAL SALPINGECTOMY;  Surgeon: [name], MD;  Location: [facility];  Service: Gynecology;  Laterality: Bilateral;  to follow first case    Current Outpatient Medications  Medication Sig Dispense Refill   Multiple Vitamin (MULTIVITAMIN) tablet Take 1 tablet by mouth daily.     valACYclovir (VALTREX) 500 MG tablet 1 tab bid x 3 days for new symptoms. 30 tablet 2   No current facility-administered medications for this visit.    Family History  Problem Relation Age of Onset   Prostate cancer Father    Breast cancer Sister 23       Genetics negative    ROS: Constitutional: negative Genitourinary:negative  Exam:   BP 92/60   Pulse (!) 59   Ht 5' 1.75" (1.568 m)   Wt 122 lb 12.8 oz (55.7 kg)   LMP 06/01/2017 (Approximate)   BMI 22.64 kg/m    Height: 5' 1.75" (156.8 cm)  General appearance: alert, cooperative and appears stated age Head: Normocephalic, without obvious abnormality, atraumatic Neck: no adenopathy, supple, symmetrical, trachea midline and thyroid normal to inspection and palpation Lungs: clear to auscultation bilaterally Breasts: normal appearance, no masses or tenderness Heart: regular rate and rhythm Abdomen: soft, non-tender; bowel sounds normal; no masses,  no organomegaly Extremities: extremities normal, atraumatic, no cyanosis or edema Skin: Skin color, texture, turgor normal. No rashes or lesions Lymph nodes: Cervical, supraclavicular, and axillary nodes normal. No abnormal inguinal nodes palpated Neurologic: Grossly normal   Pelvic: External genitalia:  no lesions              Urethra:  normal appearing urethra with no masses, tenderness or lesions              Bartholins and Skenes: normal                 Vagina: normal appearing vagina with normal color and no discharge, no lesions              Cervix: no lesions              Pap taken: No. Bimanual Exam:  Uterus:  normal size, contour, position, consistency, mobility, non-tender              Adnexa:  normal adnexa and no mass, fullness, tenderness               Rectovaginal: pt declines               Anus: no lesions  Chaperone, Octaviano Batty, CMA, was present for exam.  Assessment/Plan: 1. Well woman exam with routine gynecological exam - Pap smear 08/2019.  Repeat next year. - Mammogram 10/2020 - Colonoscopy has not been done but cologuard neg 2022 - Bone mineral density not indicated - lab work done done with PCP - vaccines reviewed/updated.  Declines Shingrix vaccination.  2. Family history of breast cancer in sister  3. Hormone replacement therapy - receiving this through provider in [place]  4. HSV (herpes simplex virus) infection - valACYclovir (VALTREX) 500 MG tablet; 1 tab bid x 3 days for new symptoms.  Dispense: 30 tablet; Refill:  2  5. Insulin resistance - on Glucophage

## 2022-06-29 ENCOUNTER — Encounter (HOSPITAL_BASED_OUTPATIENT_CLINIC_OR_DEPARTMENT_OTHER): Payer: Self-pay | Admitting: Obstetrics & Gynecology

## 2022-11-22 ENCOUNTER — Ambulatory Visit (HOSPITAL_BASED_OUTPATIENT_CLINIC_OR_DEPARTMENT_OTHER): Payer: BC Managed Care – PPO | Admitting: Obstetrics & Gynecology

## 2023-01-17 ENCOUNTER — Encounter (HOSPITAL_BASED_OUTPATIENT_CLINIC_OR_DEPARTMENT_OTHER): Payer: Self-pay | Admitting: *Deleted

## 2023-05-10 NOTE — Progress Notes (Signed)
 58 y.o. G0P0000 Married G0 here for annual gyn exam. She works full-time. She has family in [place] and [place] and enjoys visiting both. Her Sister's Son is expecting his first child within the next week and pt will travel asap to meet the new baby. Patient has several siblings and great family support. She and spouse are not sexually active. There is no risk of STI exposure. Patient does frequent self breast exams but declines annual screening mammograms. She is followed by a Primary Care Provider in [place]. Pt on estrogen/prog hormone therapy prescribed/filled in [place]. She is unsure but thinks the HSV infection that previously affected her right buttock area may now be occasionally affecting the vulvar area. She takes Valtrex when needed and does not feel like she needs suppressive therapy at this time-prefers episodic therapy.  Patient feels well both physically and emotionally with no complaints. She is eating a well balanced diet but doesn't like fruit/vegetables. She does consume meat and red meat.   [name] last menstrual period was 06/01/2017 (approximate).          Sexually active: No.  The current method of family planning is bilateral salpingectomy.    Exercising: Yes.     Smoker:  She smokes a few puffs off of a cigarette every few weeks, plans to quit.  Health Maintenance: Pap:  08/2019 History of abnormal Pap:  no MMG:  11/15/20  Colonoscopy:  12/2020 Cologuard Negative BMD:   n/a Screening Labs:    reports that she has been smoking cigarettes. She started smoking about 18 years ago. She has never used smokeless tobacco. She reports that she does not currently use alcohol. She reports that she does not use drugs.  Past Medical History:  Diagnosis Date   History of kidney stones    HSV infection 2004   R Buttock   Insulin resistance     Past Surgical History:  Procedure Laterality Date   LAPAROSCOPIC BILATERAL SALPINGECTOMY Bilateral 11/25/2019   Procedure: LAPAROSCOPIC  BILATERAL SALPINGECTOMY;  Surgeon: Jill Bears, MD;  Location: [facility];  Service: Gynecology;  Laterality: Bilateral;  to follow first case    Current Outpatient Medications  Medication Sig Dispense Refill   drospirenone-estradiol (ANGELIQ) 0.5-1 MG tablet Take 1 tablet by mouth daily. 84 tablet 3   Multiple Vitamin (MULTIVITAMIN) tablet Take 1 tablet by mouth daily.     valACYclovir (VALTREX) 500 MG tablet 1 tab bid x 3 days for new symptoms. 30 tablet 2   No current facility-administered medications for this visit.    Family History  Problem Relation Age of Onset   Prostate cancer Father    Breast cancer Sister 28       Genetics negative    ROS: Constitutional: negative Genitourinary:negative  Exam:   LMP 06/01/2017 (Approximate)      General appearance: alert, cooperative and appears stated age Head: Normocephalic, without obvious abnormality, atraumatic Neck: no adenopathy, supple, symmetrical, trachea midline and thyroid  Lungs: clear to auscultation bilaterally Breasts: normal appearance, no masses or tenderness, Inspection negative, No nipple retraction or dimpling, No nipple discharge or bleeding, No axillary or supraclavicular adenopathy, Normal to palpation without dominant masses Heart: regular rate and rhythm Abdomen: soft, non-tender; bowel sounds normal; no masses,  no organomegaly Extremities: extremities normal, atraumatic, no cyanosis or edema Skin: Skin color, texture, turgor normal. No rashes or lesions Lymph nodes: Cervical, supraclavicular, and axillary nodes normal. No abnormal inguinal nodes palpated Neurologic: Grossly normal   Pelvic: External genitalia:  no lesions              Urethra:  normal appearing urethra with no masses, tenderness or lesions              Bartholins and Skenes: normal                 Vagina: normal appearing vagina with normal color and no discharge, no lesions              Cervix: no cervical motion  tenderness, no lesions, nulliparous appearance, and cervical polyp, pedunculated, red, present.               Pap taken: Yes.   Bimanual Exam:  Uterus:  normal and normal size, contour, position, consistency, mobility, non-tender              Adnexa: no mass, fullness, tenderness               Rectovaginal: Confirms               Anus:  normal sphincter tone, no lesions  Chaperone, Jill Craig, CMA, was present for exam.  Assessment/Plan:  1. Women's annual routine gynecological examination (Primary) - Pt aware of recommendation for annual screening mammograms but prefers consistent self breast exams - She will call if any palpable abnormalities noted  2. Hormone replacement therapy - Pt on combination est/prog due to hx hot flashes/night sweats. - Reports HT working effectively to reduce hot flashes/night sweats  3. Annual physical exam - pt desired routine screening labs - Lipid Panel With LDL/HDL Ratio - Hemoglobin A1c - TSH - CBC - Comp Met (CMET)  4. Postmenopausal - Consider Folic Acid daily - consider daily probiotic for gut health   5. Cervical polyp - Pt given option to remove cervical polyp today and she desired removal - Cervical polyp grasped with ring forcep which was twisted in circular fashion and polyp was removed. Scant bleeding.  - Surgical pathology( [place]/ [facility])  6. Cervical cancer screening - No risk of STD, no desire for testing - Cytology - PAP( Jill Craig)  7. HSV (herpes simplex virus) anogenital infection - Rx sent for Valtrex to be used with outbreak - Declines suppressive therapy at this time - valACYclovir (VALTREX) 500 MG tablet; 1 tab bid x 3 days for new symptoms.  Dispense: 30 tablet; Refill: 2   8. Minimal tobacco use - Pt trying/plans to quit tobacco use   RTO 1 year for annual gyn exam and prn if issues arise. Patient has a Primary Care Provider in [place] and will follow with him for Primary Care. Discuss  colon cancer screening with PCP.  Jill Craig

## 2023-05-17 ENCOUNTER — Ambulatory Visit (HOSPITAL_BASED_OUTPATIENT_CLINIC_OR_DEPARTMENT_OTHER): Payer: BC Managed Care – PPO | Admitting: Obstetrics & Gynecology

## 2023-05-17 ENCOUNTER — Ambulatory Visit (HOSPITAL_BASED_OUTPATIENT_CLINIC_OR_DEPARTMENT_OTHER): Payer: BC Managed Care – PPO | Admitting: Certified Nurse Midwife

## 2023-05-17 ENCOUNTER — Other Ambulatory Visit (HOSPITAL_COMMUNITY)
Admission: RE | Admit: 2023-05-17 | Discharge: 2023-05-17 | Disposition: A | Discharge status: Home / Self Care | Payer: BC Managed Care – PPO | Source: Ambulatory Visit | Attending: Certified Nurse Midwife | Admitting: Certified Nurse Midwife

## 2023-05-17 ENCOUNTER — Encounter (HOSPITAL_BASED_OUTPATIENT_CLINIC_OR_DEPARTMENT_OTHER): Payer: Self-pay | Admitting: Certified Nurse Midwife

## 2023-05-17 VITALS — BP 109/67 | HR 59 | Ht 61.75 in | Wt 125.2 lb

## 2023-05-17 DIAGNOSIS — Z0000 Encounter for general adult medical examination without abnormal findings: Secondary | ICD-10-CM

## 2023-05-17 DIAGNOSIS — Z01419 Encounter for gynecological examination (general) (routine) without abnormal findings: Secondary | ICD-10-CM

## 2023-05-17 DIAGNOSIS — Z124 Encounter for screening for malignant neoplasm of cervix: Secondary | ICD-10-CM

## 2023-05-17 DIAGNOSIS — N841 Polyp of cervix uteri: Secondary | ICD-10-CM | POA: Insufficient documentation

## 2023-05-17 DIAGNOSIS — Z79890 Hormone replacement therapy (postmenopausal): Secondary | ICD-10-CM | POA: Diagnosis not present

## 2023-05-17 DIAGNOSIS — A609 Anogenital herpesviral infection, unspecified: Secondary | ICD-10-CM

## 2023-05-17 DIAGNOSIS — B009 Herpesviral infection, unspecified: Secondary | ICD-10-CM

## 2023-05-17 DIAGNOSIS — Z780 Asymptomatic menopausal state: Secondary | ICD-10-CM

## 2023-05-17 MED ORDER — VALACYCLOVIR HCL 500 MG PO TABS: ORAL_TABLET | ORAL | 2 refills | Status: AC

## 2023-05-18 ENCOUNTER — Other Ambulatory Visit (HOSPITAL_BASED_OUTPATIENT_CLINIC_OR_DEPARTMENT_OTHER): Payer: Self-pay | Admitting: Certified Nurse Midwife

## 2023-05-18 ENCOUNTER — Encounter (HOSPITAL_BASED_OUTPATIENT_CLINIC_OR_DEPARTMENT_OTHER): Payer: Self-pay | Admitting: Certified Nurse Midwife

## 2023-05-18 DIAGNOSIS — R7303 Prediabetes: Secondary | ICD-10-CM

## 2023-05-18 LAB — COMPREHENSIVE METABOLIC PANEL
ALT: 15 [IU]/L (ref 0–32)
AST: 20 [IU]/L (ref 0–40)
Albumin: 4.7 g/dL (ref 3.8–4.9)
Alkaline Phosphatase: 61 [IU]/L (ref 44–121)
BUN/Creatinine Ratio: 22 (ref 9–23)
BUN: 19 mg/dL (ref 6–24)
Bilirubin Total: 0.4 mg/dL (ref 0.0–1.2)
CO2: 21 mmol/L (ref 20–29)
Calcium: 10.1 mg/dL (ref 8.7–10.2)
Chloride: 107 mmol/L — ABNORMAL HIGH (ref 96–106)
Creatinine, Ser: 0.85 mg/dL (ref 0.57–1.00)
Globulin, Total: 2.9 g/dL (ref 1.5–4.5)
Glucose: 89 mg/dL (ref 70–99)
Potassium: 4.8 mmol/L (ref 3.5–5.2)
Sodium: 145 mmol/L — ABNORMAL HIGH (ref 134–144)
Total Protein: 7.6 g/dL (ref 6.0–8.5)
eGFR: 79 mL/min/{1.73_m2} (ref >59)

## 2023-05-18 LAB — LIPID PANEL WITH LDL/HDL RATIO
Cholesterol, Total: 277 mg/dL — ABNORMAL HIGH (ref 100–199)
HDL: 79 mg/dL (ref >39)
LDL Chol Calc (NIH): 182 mg/dL — ABNORMAL HIGH (ref 0–99)
LDL/HDL Ratio: 2.3 {ratio} (ref 0.0–3.2)
Triglycerides: 97 mg/dL (ref 0–149)
VLDL Cholesterol Cal: 16 mg/dL (ref 5–40)

## 2023-05-18 LAB — CBC
Hematocrit: 39.3 % (ref 34.0–46.6)
Hemoglobin: 12.8 g/dL (ref 11.1–15.9)
MCH: 30 pg (ref 26.6–33.0)
MCHC: 32.6 g/dL (ref 31.5–35.7)
MCV: 92 fL (ref 79–97)
Platelets: 239 10*3/uL (ref 150–450)
RBC: 4.26 x10E6/uL (ref 3.77–5.28)
RDW: 12.2 % (ref 11.7–15.4)
WBC: 8.6 10*3/uL (ref 3.4–10.8)

## 2023-05-18 LAB — HEMOGLOBIN A1C
Est. average glucose Bld gHb Est-mCnc: 117 mg/dL
Hgb A1c MFr Bld: 5.7 % — ABNORMAL HIGH (ref 4.8–5.6)

## 2023-05-18 LAB — TSH: TSH: 2.15 u[IU]/mL (ref 0.450–4.500)

## 2023-05-18 LAB — SURGICAL PATHOLOGY

## 2023-05-18 MED ORDER — METFORMIN HCL ER 500 MG PO TB24: 500.0000 mg | ORAL_TABLET | Freq: Every day | ORAL | 4 refills | Status: AC

## 2023-05-21 LAB — CYTOLOGY - PAP
Comment: NEGATIVE
Diagnosis: UNDETERMINED — AB
High risk HPV: NEGATIVE

## 2023-06-13 ENCOUNTER — Other Ambulatory Visit (HOSPITAL_BASED_OUTPATIENT_CLINIC_OR_DEPARTMENT_OTHER): Payer: Self-pay | Admitting: Obstetrics & Gynecology

## 2023-06-13 DIAGNOSIS — M26633 Articular disc disorder of bilateral temporomandibular joint: Secondary | ICD-10-CM | POA: Diagnosis not present

## 2023-06-13 DIAGNOSIS — Z1231 Encounter for screening mammogram for malignant neoplasm of breast: Secondary | ICD-10-CM

## 2023-06-15 ENCOUNTER — Other Ambulatory Visit: Payer: Self-pay | Admitting: Dentistry

## 2023-06-15 DIAGNOSIS — M26632 Articular disc disorder of left temporomandibular joint: Secondary | ICD-10-CM

## 2023-06-17 ENCOUNTER — Other Ambulatory Visit

## 2023-06-19 ENCOUNTER — Encounter (HOSPITAL_BASED_OUTPATIENT_CLINIC_OR_DEPARTMENT_OTHER): Payer: Self-pay | Admitting: Radiology

## 2023-06-19 ENCOUNTER — Ambulatory Visit (HOSPITAL_BASED_OUTPATIENT_CLINIC_OR_DEPARTMENT_OTHER)
Admission: RE | Admit: 2023-06-19 | Discharge: 2023-06-19 | Disposition: A | Discharge status: Home / Self Care | Source: Ambulatory Visit | Attending: Obstetrics & Gynecology | Admitting: Obstetrics & Gynecology

## 2023-06-19 DIAGNOSIS — Z1231 Encounter for screening mammogram for malignant neoplasm of breast: Secondary | ICD-10-CM | POA: Insufficient documentation

## 2023-06-20 ENCOUNTER — Ambulatory Visit
Admission: RE | Admit: 2023-06-20 | Discharge: 2023-06-20 | Disposition: A | Discharge status: Home / Self Care | Source: Ambulatory Visit | Attending: Dentistry | Admitting: Dentistry

## 2023-06-20 DIAGNOSIS — M26622 Arthralgia of left temporomandibular joint: Secondary | ICD-10-CM | POA: Diagnosis not present

## 2023-06-20 DIAGNOSIS — M26632 Articular disc disorder of left temporomandibular joint: Secondary | ICD-10-CM

## 2023-07-04 DIAGNOSIS — M26633 Articular disc disorder of bilateral temporomandibular joint: Secondary | ICD-10-CM | POA: Diagnosis not present

## 2023-07-31 DIAGNOSIS — M26633 Articular disc disorder of bilateral temporomandibular joint: Secondary | ICD-10-CM | POA: Diagnosis not present

## 2023-08-01 ENCOUNTER — Other Ambulatory Visit (HOSPITAL_BASED_OUTPATIENT_CLINIC_OR_DEPARTMENT_OTHER): Payer: Self-pay | Admitting: *Deleted

## 2023-08-01 DIAGNOSIS — Z79890 Hormone replacement therapy (postmenopausal): Secondary | ICD-10-CM

## 2023-08-01 MED ORDER — ANGELIQ 0.5-1 MG PO TABS: 1.0000 | ORAL_TABLET | Freq: Every day | ORAL | 3 refills | Status: DC

## 2023-08-01 NOTE — Progress Notes (Signed)
 Pt called requesting refill on Angeliq . Recently saw Abe Abed but did not need refill at that time. Refill sent to requested pharmacy.

## 2023-08-14 ENCOUNTER — Telehealth (HOSPITAL_BASED_OUTPATIENT_CLINIC_OR_DEPARTMENT_OTHER): Payer: Self-pay | Admitting: *Deleted

## 2023-08-14 NOTE — Telephone Encounter (Signed)
 Pt left message that her angeliq  medication was not covered by insurance. LMOVM that PA was obtained through her insurance on 08/01/23. Advised pt to call [facility] and have them run through insurance again and let us  know if she has any issues.

## 2024-04-11 ENCOUNTER — Telehealth (HOSPITAL_BASED_OUTPATIENT_CLINIC_OR_DEPARTMENT_OTHER): Payer: Self-pay

## 2024-04-11 ENCOUNTER — Other Ambulatory Visit (HOSPITAL_BASED_OUTPATIENT_CLINIC_OR_DEPARTMENT_OTHER): Payer: Self-pay

## 2024-04-11 DIAGNOSIS — Z79890 Hormone replacement therapy (postmenopausal): Secondary | ICD-10-CM

## 2024-04-11 MED ORDER — ANGELIQ 0.5-1 MG PO TABS: 1.0000 | ORAL_TABLET | Freq: Every day | ORAL | 3 refills | Status: AC

## 2024-04-11 NOTE — Telephone Encounter (Signed)
 Received message on nurses' line that patient needs refill of Angeliq .   Left message for patient that refill was sent to [facility] and to let us  know if she has any issues.   Morna LOISE Quale, RN

## 2024-05-22 ENCOUNTER — Ambulatory Visit (HOSPITAL_BASED_OUTPATIENT_CLINIC_OR_DEPARTMENT_OTHER): Payer: Self-pay | Admitting: Physician Assistant
